# Patient Record
Sex: Female | Born: 1970
Health system: Southern US, Community
[De-identification: ages and names within clinical notes are randomized; demographics above are authoritative.]

## PROBLEM LIST (undated history)

## (undated) DIAGNOSIS — J449 Chronic obstructive pulmonary disease, unspecified: Secondary | ICD-10-CM

## (undated) DIAGNOSIS — I10 Essential (primary) hypertension: Secondary | ICD-10-CM

## (undated) DIAGNOSIS — G709 Myoneural disorder, unspecified: Secondary | ICD-10-CM

## (undated) DIAGNOSIS — F419 Anxiety disorder, unspecified: Secondary | ICD-10-CM

## (undated) DIAGNOSIS — F329 Major depressive disorder, single episode, unspecified: Secondary | ICD-10-CM

## (undated) DIAGNOSIS — F32A Depression, unspecified: Secondary | ICD-10-CM

## (undated) DIAGNOSIS — T7840XA Allergy, unspecified, initial encounter: Secondary | ICD-10-CM

## (undated) DIAGNOSIS — J45909 Unspecified asthma, uncomplicated: Secondary | ICD-10-CM

## (undated) DIAGNOSIS — J439 Emphysema, unspecified: Secondary | ICD-10-CM

## (undated) HISTORY — DX: Chronic obstructive pulmonary disease, unspecified: J44.9

## (undated) HISTORY — DX: Major depressive disorder, single episode, unspecified: F32.9

## (undated) HISTORY — DX: Anxiety disorder, unspecified: F41.9

## (undated) HISTORY — DX: Allergy, unspecified, initial encounter: T78.40XA

## (undated) HISTORY — PX: OTHER SURGICAL HISTORY: SHX169

## (undated) HISTORY — DX: Myoneural disorder, unspecified: G70.9

## (undated) HISTORY — DX: Depression, unspecified: F32.A

## (undated) HISTORY — DX: Unspecified asthma, uncomplicated: J45.909

## (undated) HISTORY — DX: Emphysema, unspecified: J43.9

## (undated) HISTORY — DX: Essential (primary) hypertension: I10

---

## 2013-02-11 HISTORY — PX: OTHER SURGICAL HISTORY: SHX169

## 2015-02-12 HISTORY — PX: HERNIA REPAIR: SHX51

## 2015-03-07 DIAGNOSIS — G8928 Other chronic postprocedural pain: Secondary | ICD-10-CM | POA: Diagnosis not present

## 2015-03-07 DIAGNOSIS — Z981 Arthrodesis status: Secondary | ICD-10-CM | POA: Diagnosis not present

## 2015-03-07 DIAGNOSIS — M62838 Other muscle spasm: Secondary | ICD-10-CM | POA: Diagnosis not present

## 2015-03-07 DIAGNOSIS — M4727 Other spondylosis with radiculopathy, lumbosacral region: Secondary | ICD-10-CM | POA: Diagnosis not present

## 2015-03-07 DIAGNOSIS — M5417 Radiculopathy, lumbosacral region: Secondary | ICD-10-CM | POA: Diagnosis not present

## 2015-03-07 DIAGNOSIS — G894 Chronic pain syndrome: Secondary | ICD-10-CM | POA: Diagnosis not present

## 2015-06-15 DIAGNOSIS — Z79891 Long term (current) use of opiate analgesic: Secondary | ICD-10-CM | POA: Diagnosis not present

## 2015-06-15 DIAGNOSIS — Z981 Arthrodesis status: Secondary | ICD-10-CM | POA: Diagnosis not present

## 2015-06-15 DIAGNOSIS — M4727 Other spondylosis with radiculopathy, lumbosacral region: Secondary | ICD-10-CM | POA: Diagnosis not present

## 2015-06-15 DIAGNOSIS — M62838 Other muscle spasm: Secondary | ICD-10-CM | POA: Diagnosis not present

## 2015-06-15 DIAGNOSIS — G894 Chronic pain syndrome: Secondary | ICD-10-CM | POA: Diagnosis not present

## 2015-09-13 DIAGNOSIS — Z79899 Other long term (current) drug therapy: Secondary | ICD-10-CM | POA: Diagnosis not present

## 2015-09-13 DIAGNOSIS — G894 Chronic pain syndrome: Secondary | ICD-10-CM | POA: Diagnosis not present

## 2015-09-13 DIAGNOSIS — M62838 Other muscle spasm: Secondary | ICD-10-CM | POA: Diagnosis not present

## 2015-09-13 DIAGNOSIS — M4727 Other spondylosis with radiculopathy, lumbosacral region: Secondary | ICD-10-CM | POA: Diagnosis not present

## 2015-09-13 DIAGNOSIS — Z981 Arthrodesis status: Secondary | ICD-10-CM | POA: Diagnosis not present

## 2015-10-11 DIAGNOSIS — D1721 Benign lipomatous neoplasm of skin and subcutaneous tissue of right arm: Secondary | ICD-10-CM | POA: Diagnosis not present

## 2015-10-19 DIAGNOSIS — L723 Sebaceous cyst: Secondary | ICD-10-CM | POA: Diagnosis not present

## 2015-10-19 DIAGNOSIS — D170 Benign lipomatous neoplasm of skin and subcutaneous tissue of head, face and neck: Secondary | ICD-10-CM | POA: Diagnosis not present

## 2015-10-19 DIAGNOSIS — F17200 Nicotine dependence, unspecified, uncomplicated: Secondary | ICD-10-CM | POA: Diagnosis not present

## 2015-10-19 DIAGNOSIS — J449 Chronic obstructive pulmonary disease, unspecified: Secondary | ICD-10-CM | POA: Diagnosis not present

## 2015-10-19 DIAGNOSIS — I10 Essential (primary) hypertension: Secondary | ICD-10-CM | POA: Diagnosis not present

## 2015-10-19 DIAGNOSIS — L720 Epidermal cyst: Secondary | ICD-10-CM | POA: Diagnosis not present

## 2015-11-03 DIAGNOSIS — L723 Sebaceous cyst: Secondary | ICD-10-CM | POA: Diagnosis not present

## 2015-11-10 DIAGNOSIS — L723 Sebaceous cyst: Secondary | ICD-10-CM | POA: Diagnosis not present

## 2015-11-24 DIAGNOSIS — L723 Sebaceous cyst: Secondary | ICD-10-CM | POA: Diagnosis not present

## 2015-11-27 ENCOUNTER — Ambulatory Visit (INDEPENDENT_AMBULATORY_CARE_PROVIDER_SITE_OTHER): Payer: Medicare Other

## 2015-11-27 ENCOUNTER — Ambulatory Visit (INDEPENDENT_AMBULATORY_CARE_PROVIDER_SITE_OTHER): Payer: Medicare Other | Admitting: Family Medicine

## 2015-11-27 VITALS — BP 142/88 | HR 88 | Temp 98.4°F | Resp 18 | Ht 67.75 in | Wt 140.2 lb

## 2015-11-27 DIAGNOSIS — J209 Acute bronchitis, unspecified: Secondary | ICD-10-CM

## 2015-11-27 DIAGNOSIS — J441 Chronic obstructive pulmonary disease with (acute) exacerbation: Secondary | ICD-10-CM | POA: Diagnosis not present

## 2015-11-27 DIAGNOSIS — I10 Essential (primary) hypertension: Secondary | ICD-10-CM | POA: Diagnosis not present

## 2015-11-27 DIAGNOSIS — R079 Chest pain, unspecified: Secondary | ICD-10-CM | POA: Diagnosis not present

## 2015-11-27 MED ORDER — BENZONATATE 100 MG PO CAPS: 100.0000 mg | ORAL_CAPSULE | Freq: Three times a day (TID) | ORAL | 0 refills | Status: DC | PRN

## 2015-11-27 MED ORDER — ALBUTEROL SULFATE HFA 108 (90 BASE) MCG/ACT IN AERS: 2.0000 | INHALATION_SPRAY | RESPIRATORY_TRACT | 1 refills | Status: DC | PRN

## 2015-11-27 MED ORDER — AMOXICILLIN-POT CLAVULANATE 875-125 MG PO TABS: 1.0000 | ORAL_TABLET | Freq: Two times a day (BID) | ORAL | 0 refills | Status: DC

## 2015-11-27 MED ORDER — BECLOMETHASONE DIPROPIONATE 80 MCG/ACT IN AERS: 1.0000 | INHALATION_SPRAY | Freq: Two times a day (BID) | RESPIRATORY_TRACT | 1 refills | Status: DC

## 2015-11-27 MED ORDER — IPRATROPIUM BROMIDE 0.02 % IN SOLN
0.5000 mg | Freq: Once | RESPIRATORY_TRACT | Status: AC
  Administered 2015-11-27: 0.5 mg via RESPIRATORY_TRACT

## 2015-11-27 MED ORDER — LISINOPRIL 10 MG PO TABS: 10.0000 mg | ORAL_TABLET | Freq: Every day | ORAL | 0 refills | Status: DC

## 2015-11-27 MED ORDER — ALBUTEROL SULFATE (2.5 MG/3ML) 0.083% IN NEBU
2.5000 mg | INHALATION_SOLUTION | Freq: Once | RESPIRATORY_TRACT | Status: AC
  Administered 2015-11-27: 2.5 mg via RESPIRATORY_TRACT

## 2015-11-27 MED ORDER — METOPROLOL TARTRATE 50 MG PO TABS: 50.0000 mg | ORAL_TABLET | Freq: Two times a day (BID) | ORAL | 0 refills | Status: DC

## 2015-11-27 MED ORDER — TIOTROPIUM BROMIDE MONOHYDRATE 18 MCG IN CAPS: 18.0000 ug | ORAL_CAPSULE | Freq: Every day | RESPIRATORY_TRACT | 2 refills | Status: DC

## 2015-11-27 NOTE — Progress Notes (Signed)
Patient ID: Rita Hunter, female    DOB: [birth year], 45 y.o.   MRN: [id number]  PCP: No PCP Per Patient  Chief Complaint  Patient presents with  . COPD    Subjective:   HPI 45 year old female, presents for evaluation of COPD exacerbation. She recently located to [facility] from [place] and has not seen a healthcare provider in months. She has gone at least one month without her chronic medications and presents today with a 4 days complain of cough, headache, chest tightness, moderate colored sputum production. Subjective fever,She has taken Nyquil for cough with minimal relief of symptoms. She also requests refills of her blood pressure medications lisinopril and metoprolol. She reports her blood pressure has been elevated over the last month due to not having any medication. Denies shortness of breath, chest tightness, no chest pain, or headaches. She requests a referral to pulmonology as she was followed by this specialty in the past for management of COPD.  Social History   Social History  . Marital status: Single    Spouse name: N/A  . Number of children: N/A  . Years of education: N/A   Occupational History  . Not on file.   Social History Main Topics  . Smoking status: Current Some Day Smoker  . Smokeless tobacco: Never Used  . Alcohol use No  . Drug use: No  . Sexual activity: Not on file   Other Topics Concern  . Not on file   Social History Narrative  . No narrative on file   Family History  Problem Relation Age of Onset  . Diabetes Mother   . Heart disease Mother   . Mental illness Mother   . Hyperlipidemia Father   . Stroke Father   . Diabetes Sister   . Heart disease Sister   . Hyperlipidemia Sister   . Stroke Sister      Review of Systems See HPI There are no active problems to display for this patient.    Prior to Admission medications   Medication Sig Start Date End Date Taking? Authorizing Provider  beclomethasone (QVAR) 80 MCG/ACT inhaler  Inhale 1 puff into the lungs 2 (two) times daily.   Yes Historical Provider, MD  butalbital-acetaminophen-caffeine (FIORICET WITH CODEINE) 50-325-40-30 MG capsule Take 1 capsule by mouth as needed for headache.   Yes Historical Provider, MD  diazepam (VALIUM) 5 MG tablet Take 5 mg by mouth every 6 (six) hours as needed for anxiety.   Yes Historical Provider, MD  fluticasone-salmeterol (ADVAIR HFA) 115-21 MCG/ACT inhaler Inhale 2 puffs into the lungs 2 (two) times daily.   Yes Historical Provider, MD  gabapentin (NEURONTIN) 100 MG capsule Take 100 mg by mouth 3 (three) times daily.   Yes Historical Provider, MD  HYDROcodone-acetaminophen (NORCO) 10-325 MG tablet Take 1 tablet by mouth every 6 (six) hours as needed.   Yes Historical Provider, MD  HYDROcodone-acetaminophen (NORCO/VICODIN) 5-325 MG tablet Take 1 tablet by mouth every 6 (six) hours as needed for moderate pain.   Yes Historical Provider, MD  ketorolac (TORADOL) 10 MG tablet Take 10 mg by mouth every 6 (six) hours as needed.   Yes Historical Provider, MD  lisinopril (PRINIVIL,ZESTRIL) 10 MG tablet Take 10 mg by mouth daily.   Yes Historical Provider, MD  metoprolol (LOPRESSOR) 50 MG tablet Take 50 mg by mouth 2 (two) times daily.   Yes Historical Provider, MD  mirtazapine (REMERON) 30 MG tablet Take 30 mg by mouth at bedtime.  Yes Historical Provider, MD  naloxone HCl (NARCAN) 4 MG/0.1ML LIQD Place into the nose.   Yes Historical Provider, MD  norgestimate-ethinyl estradiol (ORTHO-CYCLEN,SPRINTEC,PREVIFEM) 0.25-35 MG-MCG tablet Take 1 tablet by mouth daily.   Yes Historical Provider, MD  QUEtiapine (SEROQUEL) 200 MG tablet Take 200 mg by mouth at bedtime.   Yes Historical Provider, MD  sertraline (ZOLOFT) 100 MG tablet Take 100 mg by mouth daily.   Yes Historical Provider, MD  sulfamethoxazole-trimethoprim (BACTRIM DS,SEPTRA DS) 800-160 MG tablet Take 1 tablet by mouth 2 (two) times daily.   Yes Historical Provider, MD  SUMAtriptan  Succinate (SUMAVEL DOSEPRO [place]) Inject into the skin.   Yes Historical Provider, MD  tiotropium (SPIRIVA) 18 MCG inhalation capsule Place 18 mcg into inhaler and inhale daily.   Yes Historical Provider, MD  tiZANidine (ZANAFLEX) 4 MG tablet Take 4 mg by mouth every 6 (six) hours as needed for muscle spasms.   Yes Historical Provider, MD  topiramate (TOPAMAX) 25 MG tablet Take 25 mg by mouth 2 (two) times daily.   Yes Historical Provider, MD     Allergies  Allergen Reactions  . Mobic [Meloxicam] Swelling    Swelling of tongue   . Ultram [Tramadol Hcl] Swelling    Swelling of tongue    Objective:  Physical Exam  Constitutional: She is oriented to person, place, and time. She appears well-developed and well-nourished.  HENT:  Head: Normocephalic and atraumatic.  Right Ear: External ear normal.  Left Ear: External ear normal.  Mouth/Throat: Oropharynx is clear and moist.  Eyes: Conjunctivae and EOM are normal. Pupils are equal, round, and reactive to light.  Neck: Normal range of motion. Neck supple.  Cardiovascular: Normal rate, regular rhythm, normal heart sounds and intact distal pulses.   Pulmonary/Chest: She has no wheezes. She exhibits no tenderness.  Bilateral posterior lung sounds diminished. Notable diminished air movement upper bronchial region of anterior chest wall. Hacking wet non-productive type of cough  Musculoskeletal: Normal range of motion.  Lymphadenopathy:    She has cervical adenopathy.  Neurological: She is alert and oriented to person, place, and time. She has normal reflexes.  Skin: Skin is warm and dry.  Psychiatric: She has a normal mood and affect. Her behavior is normal. Thought content normal.    Vitals:   11/27/15 1132  BP: (!) 142/88  Pulse: 88  Resp: 18  Temp: 98.4 F (36.9 C)   Assessment & Plan:  1. Acute bronchitis, unspecified organism - DG Chest 2 View Plan: -Amoxicillin-clavulanate (Augmentin) 875-125 mg tablet 2 times,  daily -Benzonatate (Tessalon) 100 mg, take 100-200 mg up to 3 times daily as needed for cough.  2. COPD exacerbation ([place]) Plan: -Chest x-ray ruled out pneumonia, right middle lobe calcified granuloma noted-referring to pulmonology for further evaluation.  - ipratropium (ATROVENT) nebulizer solution 0.5 mg; Take 2.5 mLs (0.5 mg total) by nebulization once. - albuterol (PROVENTIL) (2.5 MG/3ML) 0.083% nebulizer solution 2.5 mg; Take 3 mLs (2.5 mg total) by nebulization once.  -Albuterol (Proventil HFA: Ventolin HFA) 108 (90 Base) MCG/ACT inhaler 2 puffs every 4 hours as needed for shortness or breath or wheezing  -Beclomethasone (Qvar) 80 Mcg/ACT inhaler 1 puff, 2 times daily   - Ambulatory referral to Pulmonology  -Smoking Cessation encouraged   3. Essential hypertension, uncontrolled. Patient reports being out of medications for over 1 month.   Plan: Resume Lisinopril 10 mg daily and Metroprolol 50 mg, 2 times daily.    Advised to schedule a visit to establish  care. I am unable to refill chronic medications until care is established and each of your chronic medical condition have been evaluated.  Follow-up as needed.  Carroll Sage. Kenton Kingfisher, MSN, FNP-C [facility]

## 2015-11-27 NOTE — Patient Instructions (Addendum)
Acute bronchitis Start Augmentin 1 tablet twice daily for treatment of acute bronchitis.  Take benzonatate 100-200 mg up to three times daily as needed.   COPD Start Albuterol 2 puffs every 4-6 hours as needed for wheezing and or shortness of breath.  Resume Spiriva 18 mcg daily inhalations.  Resume Qvar 1 puff, 2 times daily  Referral submitted to pulmonology for evaluation and treatment of COPD   Hypertension Resume lisinopril 10 mg daily  Resume metoprolol 50 mg twice daily   IF you received an x-ray today, you will receive an invoice from [facility]. Please contact [facility] at [phone] with questions or concerns regarding your invoice.   IF you received labwork today, you will receive an invoice from [employer]. Please contact [facility] at [phone] with questions or concerns regarding your invoice.   Our billing staff will not be able to assist you with questions regarding bills from these companies.  You will be contacted with the lab results as soon as they are available. The fastest way to get your results is to activate your My Chart account. Instructions are located on the last page of this paperwork. If you have not heard from [place] regarding the results in 2 weeks, please contact this office.

## 2015-12-18 DIAGNOSIS — G894 Chronic pain syndrome: Secondary | ICD-10-CM | POA: Diagnosis not present

## 2015-12-18 DIAGNOSIS — M62838 Other muscle spasm: Secondary | ICD-10-CM | POA: Diagnosis not present

## 2015-12-18 DIAGNOSIS — M4727 Other spondylosis with radiculopathy, lumbosacral region: Secondary | ICD-10-CM | POA: Diagnosis not present

## 2015-12-18 DIAGNOSIS — Z981 Arthrodesis status: Secondary | ICD-10-CM | POA: Diagnosis not present

## 2015-12-20 ENCOUNTER — Ambulatory Visit (INDEPENDENT_AMBULATORY_CARE_PROVIDER_SITE_OTHER): Payer: Medicare Other | Admitting: Family Medicine

## 2015-12-20 VITALS — BP 160/90 | HR 90 | Temp 98.6°F | Resp 17 | Ht 67.75 in | Wt 143.0 lb

## 2015-12-20 DIAGNOSIS — G43809 Other migraine, not intractable, without status migrainosus: Secondary | ICD-10-CM | POA: Diagnosis not present

## 2015-12-20 DIAGNOSIS — I10 Essential (primary) hypertension: Secondary | ICD-10-CM

## 2015-12-20 DIAGNOSIS — N83202 Unspecified ovarian cyst, left side: Secondary | ICD-10-CM | POA: Diagnosis not present

## 2015-12-20 DIAGNOSIS — F331 Major depressive disorder, recurrent, moderate: Secondary | ICD-10-CM | POA: Diagnosis not present

## 2015-12-20 DIAGNOSIS — N83201 Unspecified ovarian cyst, right side: Secondary | ICD-10-CM | POA: Diagnosis not present

## 2015-12-20 DIAGNOSIS — F39 Unspecified mood [affective] disorder: Secondary | ICD-10-CM | POA: Diagnosis not present

## 2015-12-20 DIAGNOSIS — G894 Chronic pain syndrome: Secondary | ICD-10-CM | POA: Diagnosis not present

## 2015-12-20 LAB — CBC WITH DIFFERENTIAL/PLATELET
BASOS ABS: 0 {cells}/uL (ref 0–200)
Basophils Relative: 0 %
EOS PCT: 2 %
Eosinophils Absolute: 198 cells/uL (ref 15–500)
HEMATOCRIT: 41.9 % (ref 35.0–45.0)
HEMOGLOBIN: 13.9 g/dL (ref 11.7–15.5)
LYMPHS PCT: 28 %
Lymphs Abs: 2772 cells/uL (ref 850–3900)
MCH: 31.5 pg (ref 27.0–33.0)
MCHC: 33.2 g/dL (ref 32.0–36.0)
MCV: 95 fL (ref 80.0–100.0)
MPV: 10.4 fL (ref 7.5–12.5)
Monocytes Absolute: 891 cells/uL (ref 200–950)
Monocytes Relative: 9 %
NEUTROS PCT: 61 %
Neutro Abs: 6039 cells/uL (ref 1500–7800)
Platelets: 361 10*3/uL (ref 140–400)
RBC: 4.41 MIL/uL (ref 3.80–5.10)
RDW: 12.8 % (ref 11.0–15.0)
WBC: 9.9 10*3/uL (ref 3.8–10.8)

## 2015-12-20 LAB — COMPLETE METABOLIC PANEL WITH GFR
ALBUMIN: 4.3 g/dL (ref 3.6–5.1)
ALK PHOS: 48 U/L (ref 33–115)
ALT: 12 U/L (ref 6–29)
AST: 18 U/L (ref 10–35)
BUN: 11 mg/dL (ref 7–25)
CALCIUM: 9.5 mg/dL (ref 8.6–10.2)
CHLORIDE: 103 mmol/L (ref 98–110)
CO2: 24 mmol/L (ref 20–31)
Creat: 0.7 mg/dL (ref 0.50–1.10)
GFR, Est African American: >89 mL/min (ref >=60)
Glucose, Bld: 102 mg/dL — ABNORMAL HIGH (ref 65–99)
POTASSIUM: 4.3 mmol/L (ref 3.5–5.3)
SODIUM: 138 mmol/L (ref 135–146)
Total Bilirubin: 0.3 mg/dL (ref 0.2–1.2)
Total Protein: 6.9 g/dL (ref 6.1–8.1)

## 2015-12-20 LAB — POC MICROSCOPIC URINALYSIS (UMFC): Mucus: ABSENT

## 2015-12-20 LAB — POCT URINALYSIS DIP (MANUAL ENTRY)
Bilirubin, UA: NEGATIVE
Glucose, UA: NEGATIVE
Ketones, POC UA: NEGATIVE
LEUKOCYTES UA: NEGATIVE
Nitrite, UA: NEGATIVE
PROTEIN UA: NEGATIVE
Spec Grav, UA: <=1.005
UROBILINOGEN UA: 0.2
pH, UA: 5.5

## 2015-12-20 LAB — LIPID PANEL
CHOL/HDL RATIO: 1.9 ratio (ref <5.0)
CHOLESTEROL: 158 mg/dL (ref <200)
HDL: 85 mg/dL (ref >50)
LDL Cholesterol: 51 mg/dL
TRIGLYCERIDES: 112 mg/dL (ref <150)
VLDL: 22 mg/dL (ref <30)

## 2015-12-20 LAB — TSH: TSH: 1.22 m[IU]/L

## 2015-12-20 MED ORDER — HYDROCODONE-ACETAMINOPHEN 7.5-325 MG PO TABS: 1.0000 | ORAL_TABLET | Freq: Four times a day (QID) | ORAL | 0 refills | Status: DC | PRN

## 2015-12-20 MED ORDER — TIZANIDINE HCL 4 MG PO TABS: 4.0000 mg | ORAL_TABLET | Freq: Four times a day (QID) | ORAL | 2 refills | Status: DC | PRN

## 2015-12-20 MED ORDER — GABAPENTIN 300 MG PO CAPS: 600.0000 mg | ORAL_CAPSULE | Freq: Three times a day (TID) | ORAL | 1 refills | Status: DC

## 2015-12-20 MED ORDER — HYDROCODONE-ACETAMINOPHEN 7.5-325 MG PO TABS
1.0000 | ORAL_TABLET | Freq: Four times a day (QID) | ORAL | 0 refills | Status: DC | PRN
Start: 2015-12-20 — End: 2015-12-20

## 2015-12-20 MED ORDER — FLUTICASONE-SALMETEROL 115-21 MCG/ACT IN AERO: 2.0000 | INHALATION_SPRAY | Freq: Two times a day (BID) | RESPIRATORY_TRACT | 11 refills | Status: DC

## 2015-12-20 MED ORDER — SERTRALINE HCL 100 MG PO TABS: 100.0000 mg | ORAL_TABLET | Freq: Every day | ORAL | 3 refills | Status: DC

## 2015-12-20 MED ORDER — TIOTROPIUM BROMIDE MONOHYDRATE 18 MCG IN CAPS: 18.0000 ug | ORAL_CAPSULE | Freq: Every day | RESPIRATORY_TRACT | 6 refills | Status: DC

## 2015-12-20 MED ORDER — LISINOPRIL 10 MG PO TABS: 10.0000 mg | ORAL_TABLET | Freq: Every day | ORAL | 3 refills | Status: DC

## 2015-12-20 MED ORDER — METOPROLOL TARTRATE 50 MG PO TABS: 50.0000 mg | ORAL_TABLET | Freq: Two times a day (BID) | ORAL | 3 refills | Status: DC

## 2015-12-20 MED ORDER — TOPIRAMATE 25 MG PO TABS: 25.0000 mg | ORAL_TABLET | Freq: Two times a day (BID) | ORAL | 3 refills | Status: DC

## 2015-12-20 MED ORDER — QUETIAPINE FUMARATE 200 MG PO TABS: 200.0000 mg | ORAL_TABLET | Freq: Every day | ORAL | 3 refills | Status: DC

## 2015-12-20 NOTE — Patient Instructions (Addendum)
See the attached for list of pain management clinics.  I am placing a referral for you to see an OB/GYN.  All medications have been refilled.  Continue to monitor blood pressure medications.  IF you received an x-ray today, you will receive an invoice from [facility]. Please contact [facility] at [phone] with questions or concerns regarding your invoice.   IF you received labwork today, you will receive an invoice from [employer]. Please contact [facility] at [phone] with questions or concerns regarding your invoice.   Our billing staff will not be able to assist you with questions regarding bills from these companies.  You will be contacted with the lab results as soon as they are available. The fastest way to get your results is to activate your My Chart account. Instructions are located on the last page of this paperwork. If you have not heard from [place] regarding the results in 2 weeks, please contact this office.     Hypertension Hypertension, commonly called high blood pressure, is when the force of blood pumping through your arteries is too strong. Your arteries are the blood vessels that carry blood from your heart throughout your body. A blood pressure reading consists of a higher number over a lower number, such as 110/72. The higher number (systolic) is the pressure inside your arteries when your heart pumps. The lower number (diastolic) is the pressure inside your arteries when your heart relaxes. Ideally you want your blood pressure below 120/80. Hypertension forces your heart to work harder to pump blood. Your arteries may become narrow or stiff. Having untreated or uncontrolled hypertension can cause heart attack, stroke, kidney disease, and other problems. RISK FACTORS Some risk factors for high blood pressure are controllable. Others are not.  Risk factors you cannot control include:   Race. You may be at higher risk if you  are African American.  Age. Risk increases with age.  Gender. Men are at higher risk than women before age 76 years. After age 9, women are at higher risk than men. Risk factors you can control include:  Not getting enough exercise or physical activity.  Being overweight.  Getting too much fat, sugar, calories, or salt in your diet.  Drinking too much alcohol. SIGNS AND SYMPTOMS Hypertension does not usually cause signs or symptoms. Extremely high blood pressure (hypertensive crisis) may cause headache, anxiety, shortness of breath, and nosebleed. DIAGNOSIS To check if you have hypertension, your health care provider will measure your blood pressure while you are seated, with your arm held at the level of your heart. It should be measured at least twice using the same arm. Certain conditions can cause a difference in blood pressure between your right and left arms. A blood pressure reading that is higher than normal on one occasion does not mean that you need treatment. If it is not clear whether you have high blood pressure, you may be asked to return on a different day to have your blood pressure checked again. Or, you may be asked to monitor your blood pressure at home for 1 or more weeks. TREATMENT Treating high blood pressure includes making lifestyle changes and possibly taking medicine. Living a healthy lifestyle can help lower high blood pressure. You may need to change some of your habits. Lifestyle changes may include:  Following the DASH diet. This diet is high in fruits, vegetables, and whole grains. It is low in salt, red meat, and added sugars.  Keep your sodium intake  below 2,300 mg per day.  Getting at least 30-45 minutes of aerobic exercise at least 4 times per week.  Losing weight if necessary.  Not smoking.  Limiting alcoholic beverages.  Learning ways to reduce stress. Your health care provider may prescribe medicine if lifestyle changes are not enough to get  your blood pressure under control, and if one of the following is true:  You are 34-45 years of age and your systolic blood pressure is above 140.  You are 82 years of age or older, and your systolic blood pressure is above 150.  Your diastolic blood pressure is above 90.  You have diabetes, and your systolic blood pressure is over [id number] or your diastolic blood pressure is over 90.  You have kidney disease and your blood pressure is above 140/90.  You have heart disease and your blood pressure is above 140/90. Your personal target blood pressure may vary depending on your medical conditions, your age, and other factors. HOME CARE INSTRUCTIONS  Have your blood pressure rechecked as directed by your health care provider.   Take medicines only as directed by your health care provider. Follow the directions carefully. Blood pressure medicines must be taken as prescribed. The medicine does not work as well when you skip doses. Skipping doses also puts you at risk for problems.  Do not smoke.   Monitor your blood pressure at home as directed by your health care provider. SEEK MEDICAL CARE IF:   You think you are having a reaction to medicines taken.  You have recurrent headaches or feel dizzy.  You have swelling in your ankles.  You have trouble with your vision. SEEK IMMEDIATE MEDICAL CARE IF:  You develop a severe headache or confusion.  You have unusual weakness, numbness, or feel faint.  You have severe chest or abdominal pain.  You vomit repeatedly.  You have trouble breathing. MAKE SURE YOU:   Understand these instructions.  Will watch your condition.  Will get help right away if you are not doing well or get worse.   This information is not intended to replace advice given to you by your health care provider. Make sure you discuss any questions you have with your health care provider.   Document Released: 01/28/2005 Document Revised: 06/14/2014 Document  Reviewed: 11/20/2012 Elsevier Interactive Patient Education [employer].

## 2015-12-20 NOTE — Progress Notes (Signed)
Patient ID: Rita Hunter, female    DOB: [birth year], 45 y.o.   MRN: [id number]  PCP: No PCP Per Patient  Chief Complaint  Patient presents with  . Depression    Per screening  . Establish Care    Review meds    Subjective:   HPI 45 year old female presents to establish care. She recently relocated from [place] to [place] to live with her sister. She was seen originally at [facility] 11/27/2015. Reports chronic problems include migraine headaches, depression with paranoia, hypertension, chronic pain, and COPD.   Migraine Headaches  She take Topamax 25 mg twice daily for headache prevention. Reports an occasional headache monthly. Reports visual disturbances when headaches occur. She has never been evaluated by a headache specialist before. Overall reports good headache control.  Depression She reports chronic major depression for several years. Denies any current suicidal thoughts. She has been taking Zoloft for over 3 years. Reports also reports bouts with significant paranoia and was placed on Seroquel to control those thoughts and to promote rest.  Hypertension Reports a history of elevated blood pressure. She doesn't routinely check her blood pressure and reports no regular routine exercise. Denies headaches, chest pain, and shortness of breath. She takes Metoprolol 50 mg twice daily and lisinopril 10 mg once daily.  Chronic Pain Reports her primary care provided treated her for chronic low back and foot nerve pain. She takes Hydrocodone-acetaminophen 7.5-325 mg  and Gabapentin 600 mg 3 times daily. Denies any prior evaluation by pain management.   Family history Heart disease- Sister 58 living with CVA and has CAD with stents placed at 68 years old. Dad died of CVA and Diabetes. Mother died of massive Heart attack and Diabetes.  Review of Systems See HPI  There are no active problems to display for this patient.    Prior to Admission medications     Medication Sig Start Date End Date Taking? Authorizing Provider  albuterol (PROVENTIL HFA;VENTOLIN HFA) 108 (90 Base) MCG/ACT inhaler Inhale 2 puffs into the lungs every 4 (four) hours as needed for wheezing or shortness of breath (cough, shortness of breath or wheezing.). 11/27/15  Yes Sedalia Muta, FNP  amoxicillin-clavulanate (AUGMENTIN) 875-125 MG tablet Take 1 tablet by mouth 2 (two) times daily. 11/27/15  Yes Sedalia Muta, FNP  beclomethasone (QVAR) 80 MCG/ACT inhaler Inhale 1 puff into the lungs 2 (two) times daily. 11/27/15  Yes Sedalia Muta, FNP  benzonatate (TESSALON) 100 MG capsule Take 1-2 capsules (100-200 mg total) by mouth 3 (three) times daily as needed for cough. 11/27/15  Yes Sedalia Muta, FNP  butalbital-acetaminophen-caffeine (FIORICET WITH CODEINE) [phone] MG capsule Take 1 capsule by mouth as needed for headache.   Yes Historical Provider, MD  diazepam (VALIUM) 5 MG tablet Take 5 mg by mouth every 6 (six) hours as needed for anxiety.   Yes Historical Provider, MD  fluticasone-salmeterol (ADVAIR HFA) 115-21 MCG/ACT inhaler Inhale 2 puffs into the lungs 2 (two) times daily.   Yes Historical Provider, MD  gabapentin (NEURONTIN) 100 MG capsule Take 100 mg by mouth 3 (three) times daily.   Yes Historical Provider, MD  HYDROcodone-acetaminophen (NORCO) 10-325 MG tablet Take 1 tablet by mouth every 6 (six) hours as needed.   Yes Historical Provider, MD  lisinopril (PRINIVIL,ZESTRIL) 10 MG tablet Take 1 tablet (10 mg total) by mouth daily. 11/27/15  Yes Sedalia Muta, FNP  metoprolol (LOPRESSOR) 50 MG tablet Take 1 tablet (50 mg total) by  mouth 2 (two) times daily. 11/27/15  Yes Sedalia Muta, FNP  mirtazapine (REMERON) 30 MG tablet Take 30 mg by mouth at bedtime.   Yes Historical Provider, MD  naloxone HCl (NARCAN) 4 MG/0.1ML LIQD Place into the nose.   Yes Historical Provider, MD  norgestimate-ethinyl  estradiol (ORTHO-CYCLEN,SPRINTEC,PREVIFEM) 0.25-35 MG-MCG tablet Take 1 tablet by mouth daily.   Yes Historical Provider, MD  QUEtiapine (SEROQUEL) 200 MG tablet Take 200 mg by mouth at bedtime.   Yes Historical Provider, MD  sertraline (ZOLOFT) 100 MG tablet Take 100 mg by mouth daily.   Yes Historical Provider, MD  sulfamethoxazole-trimethoprim (BACTRIM DS,SEPTRA DS) 800-160 MG tablet Take 1 tablet by mouth 2 (two) times daily.   Yes Historical Provider, MD  SUMAtriptan Succinate (SUMAVEL DOSEPRO [place]) Inject into the skin.   Yes Historical Provider, MD  tiotropium (SPIRIVA) 18 MCG inhalation capsule Place 1 capsule (18 mcg total) into inhaler and inhale daily. 11/27/15  Yes Sedalia Muta, FNP  tiZANidine (ZANAFLEX) 4 MG tablet Take 4 mg by mouth every 6 (six) hours as needed for muscle spasms.   Yes Historical Provider, MD  topiramate (TOPAMAX) 25 MG tablet Take 25 mg by mouth 2 (two) times daily.   Yes Historical Provider, MD     Allergies  Allergen Reactions  . Mobic [Meloxicam] Swelling    Swelling of tongue   . Ultram [Tramadol Hcl] Swelling    Swelling of tongue      Objective:  Physical Exam  Constitutional: She is oriented to person, place, and time. She appears well-developed and well-nourished.  HENT:  Head: Normocephalic and atraumatic.  Right Ear: External ear normal.  Left Ear: External ear normal.  Nose: Nose normal.  Mouth/Throat: Oropharynx is clear and moist.  Eyes: Conjunctivae are normal. Pupils are equal, round, and reactive to light.  Neck: Normal range of motion. Neck supple. No thyromegaly present.  Cardiovascular: Normal rate, regular rhythm, normal heart sounds and intact distal pulses.   Pulmonary/Chest: Effort normal and breath sounds normal.  Genitourinary:  Genitourinary Comments: Deferred until GYN visit.  Musculoskeletal: Normal range of motion.  Lymphadenopathy:    She has no cervical adenopathy.  Neurological: She is alert and  oriented to person, place, and time.  Skin: Skin is warm and dry.  Psychiatric: She has a normal mood and affect. Her behavior is normal. Judgment and thought content normal.   Vitals:   12/20/15 1332  BP: (!) 160/90  Pulse: 90  Resp: 17  Temp: 98.6 F (37 C)    Assessment & Plan:  1. Essential hypertension, unstable. - TSH - POCT Microscopic Urinalysis ([facility]) - POCT urinalysis dipstick - CBC with Differential/Platelet - COMPLETE METABOLIC PANEL WITH GFR - Lipid panel Plan: -Metoprolol 50 mg twice daily -Lisinopril 10 mg tablet once daily  2. Moderate episode of recurrent major depressive disorder (HCC) Plan: -Sertraline (Zoloft) 100 mg once daily  3. Mood disorder (HCC) Plan: Seroquel 200 mg, daily at bedtime  4. Chronic pain syndrome Plan: -Gabapentin 600 mg up to 3 times daily for foot nerve pain. -Hydrocodone-Acetaminophen 7.5 mg-325 mg every 6 hours as needed for pain. -Zanaflex 4 mg every 6 hours as needed for an acute headache.  5. Other migraine without status migrainosus, not intractable -Topamax 25 mg twice daily for headache prevention -Zanaflex 4 mg every 6 hours as needed for an acute headache.  6. Cysts of both ovaries,  - Ambulatory referral to Obstetrics / Gynecology  45 year old , female  presents to the clinic to establish care. I have seen her previously and treated for chronic bronchitis. She presents today for medication refills on her chronic medications. She has previously been prescribed high-risk medications such as Diazepam (Valium), Mirtazapine (Remeron), butalbital acetaminophen caffeine  (Foricet) and hydrocodone-acetaminophen.  I agreed only to refill hydrocodone-acetaminophen #20 at this time and referred patient to pain management for consideration of refilling other medication and or changing pain medicines in order for pain to be better managed.  Carroll Sage. Kenton Kingfisher, MSN, FNP-C [facility]

## 2015-12-21 ENCOUNTER — Institutional Professional Consult (permissible substitution): Payer: Self-pay | Admitting: Emergency Medicine

## 2015-12-21 ENCOUNTER — Encounter: Payer: Self-pay | Admitting: Family Medicine

## 2015-12-21 NOTE — Progress Notes (Signed)
December 21, 2015   [facility] [place] [id number]   Dear Ms. Chesney,  Below are the results from your recent visit were expected.  Resulted Orders  TSH  Result Value Ref Range   TSH 1.22 mIU/L     Comment:       Reference Range   > or = 20 Years  0.40-4.50   Pregnancy Range First trimester  0.26-2.66 Second trimester 0.55-2.73 Third trimester  0.43-2.91      Narrative   Performed at:  [address], [address]                [place], [place] [address]  CBC with Differential/Platelet  Result Value Ref Range   WBC 9.9 3.8 - 10.8 K/uL   RBC 4.41 3.80 - 5.10 MIL/uL   Hemoglobin 13.9 11.7 - 15.5 g/dL   HCT 41.9 35.0 - 45.0 %   MCV 95.0 80.0 - 100.0 fL   MCH 31.5 27.0 - 33.0 pg   MCHC 33.2 32.0 - 36.0 g/dL   RDW 12.8 11.0 - 15.0 %   Platelets 361 140 - 400 K/uL   MPV 10.4 7.5 - 12.5 fL   Neutro Abs 6,039 1,500 - 7,800 cells/uL   Lymphs Abs 2,772 850 - 3,900 cells/uL   Monocytes Absolute 891 200 - 950 cells/uL   Eosinophils Absolute 198 15 - 500 cells/uL   Basophils Absolute 0 0 - 200 cells/uL   Neutrophils Relative % 61 %   Lymphocytes Relative 28 %   Monocytes Relative 9 %   Eosinophils Relative 2 %   Basophils Relative 0 %   Smear Review Criteria for review not met    Narrative   Performed at:  [facility]                [address], [address]                [place], [place] [address]  COMPLETE METABOLIC PANEL WITH GFR  Result Value Ref Range   Sodium 138 135 - 146 mmol/L   Potassium 4.3 3.5 - 5.3 mmol/L   Chloride 103 98 - 110 mmol/L   CO2 24 20 - 31 mmol/L   Glucose, Bld 102 (H) 65 - 99 mg/dL   BUN 11 7 - 25 mg/dL   Creat 0.70 0.50 - 1.10 mg/dL   Total Bilirubin 0.3 0.2 - 1.2 mg/dL   Alkaline Phosphatase 48 33 - 115 U/L   AST 18 10 - 35 U/L   ALT 12 6 - 29 U/L   Total Protein 6.9 6.1 - 8.1 g/dL   Albumin 4.3 3.6 - 5.1 g/dL   Calcium 9.5 8.6 - 10.2 mg/dL   GFR, Est African American >89 >=60  mL/min   GFR, Est Non African American >89 >=60 mL/min   Narrative   Performed at:  [address], [address]                [place], [place] [address]  Lipid panel  Result Value Ref Range   Cholesterol 158 <200 mg/dL     Comment:     ** Please note change in reference range(s). **      Triglycerides 112 <150 mg/dL     Comment:     **  Please note change in reference range(s). **      HDL 85 >50 mg/dL     Comment:     ** Please note change in reference range(s). **      Total CHOL/HDL Ratio 1.9 <5.0 Ratio   VLDL 22 <30 mg/dL   LDL Cholesterol 51 mg/dL     Comment:     ** Please note change in reference range(s). **      Narrative   Performed at:  [address], [address]                [place], [place] [address]     If you have any questions or concerns, please don't hesitate to call.  Sincerely,   Carroll Sage. Kenton Kingfisher, MSN, FNP-C [facility]

## 2015-12-26 DIAGNOSIS — F331 Major depressive disorder, recurrent, moderate: Secondary | ICD-10-CM | POA: Insufficient documentation

## 2015-12-26 DIAGNOSIS — I10 Essential (primary) hypertension: Secondary | ICD-10-CM | POA: Insufficient documentation

## 2015-12-26 DIAGNOSIS — N83202 Unspecified ovarian cyst, left side: Secondary | ICD-10-CM

## 2015-12-26 DIAGNOSIS — G894 Chronic pain syndrome: Secondary | ICD-10-CM | POA: Insufficient documentation

## 2015-12-26 DIAGNOSIS — F39 Unspecified mood [affective] disorder: Secondary | ICD-10-CM | POA: Insufficient documentation

## 2015-12-26 DIAGNOSIS — Z79899 Other long term (current) drug therapy: Secondary | ICD-10-CM | POA: Insufficient documentation

## 2015-12-26 DIAGNOSIS — G43909 Migraine, unspecified, not intractable, without status migrainosus: Secondary | ICD-10-CM | POA: Insufficient documentation

## 2015-12-26 DIAGNOSIS — N83201 Unspecified ovarian cyst, right side: Secondary | ICD-10-CM | POA: Insufficient documentation

## 2015-12-29 ENCOUNTER — Ambulatory Visit (INDEPENDENT_AMBULATORY_CARE_PROVIDER_SITE_OTHER): Payer: Medicare Other | Admitting: Physician Assistant

## 2015-12-29 VITALS — BP 122/72 | HR 102 | Temp 98.2°F | Resp 17 | Ht 68.0 in | Wt 144.0 lb

## 2015-12-29 DIAGNOSIS — G8929 Other chronic pain: Secondary | ICD-10-CM | POA: Diagnosis not present

## 2015-12-29 DIAGNOSIS — F5101 Primary insomnia: Secondary | ICD-10-CM

## 2015-12-29 DIAGNOSIS — M545 Low back pain: Secondary | ICD-10-CM

## 2015-12-29 DIAGNOSIS — F17200 Nicotine dependence, unspecified, uncomplicated: Secondary | ICD-10-CM

## 2015-12-29 DIAGNOSIS — G2581 Restless legs syndrome: Secondary | ICD-10-CM | POA: Diagnosis not present

## 2015-12-29 MED ORDER — HYDROCODONE-ACETAMINOPHEN 10-325 MG PO TABS: 1.0000 | ORAL_TABLET | Freq: Three times a day (TID) | ORAL | 0 refills | Status: DC | PRN

## 2015-12-29 MED ORDER — HYDROCODONE-ACETAMINOPHEN 10-325 MG PO TABS: 1.0000 | ORAL_TABLET | Freq: Four times a day (QID) | ORAL | 0 refills | Status: DC | PRN

## 2015-12-29 MED ORDER — TRAZODONE HCL 50 MG PO TABS: 50.0000 mg | ORAL_TABLET | Freq: Every day | ORAL | 1 refills | Status: DC

## 2015-12-29 MED ORDER — ROPINIROLE HCL 1 MG PO TABS: 0.5000 mg | ORAL_TABLET | Freq: Every day | ORAL | 1 refills | Status: DC

## 2015-12-29 MED ORDER — VARENICLINE TARTRATE 1 MG PO TABS: 1.0000 mg | ORAL_TABLET | Freq: Two times a day (BID) | ORAL | 2 refills | Status: DC

## 2015-12-29 MED ORDER — VARENICLINE TARTRATE 0.5 MG X 11 & 1 MG X 42 PO MISC: ORAL | 0 refills | Status: DC

## 2015-12-29 NOTE — Progress Notes (Signed)
12/29/2015 12:54 PM   DOB: 06-25-1970 / MRN: [id number]  SUBJECTIVE:  Rita Hunter is a 45 y.o. female presenting to establish care. Her sister is a patient of mine.    She complains of poor sleep.  She takes seroquel for a history of paranoia and has been diagnosed with schizophrenia in the past by her previous psychiatrist.  Reports the seroquel helps her sleep but her legs twitch so badly that she wakes up.  Has tried Mirtazipine for sleep in the past and says the RLS was worse on this.  She is concerned about taking bellsomra as she read this is bad to take given her history of COPD.  She want's to try lunesta.    She has a history of chronic back pain and has previously had back surgery which does not help her symptoms.  The last surgery was roughly 6 years ago.  Her orthopedic doctor wants her to have another surgery.  She complains of chronic left leg numbness 2/2 and pain.  She takes gabapentin and does get some relief of radiculopathy with this.  She was taking Norco 10 mg every 4 hours and this was helping her with her back pain.  It is unclear why she is no longer seeing that provider.  She can to see NP Harris who fortunately reduced her dose to 7.5 every 6 hours. Patient states that she did not take this as prescribed and was taking more than what was prescribed.  Fortunaely she is not taking any gabaergics.  She would like to quit smoking.  Her sister is currently taking Chantix and she would like to try this as well.  She has tried patches, gum and these have never been helpful for her.      She is allergic to mobic [meloxicam] and ultram [tramadol hcl].   She  has a past medical history of Allergy; Anxiety; Asthma; COPD (chronic obstructive pulmonary disease) ([facility]); Depression; Emphysema of lung (Rita Hunter); and Neuromuscular disorder ([facility]ita Hunter).    She  reports that she has been smoking.  She has never used smokeless tobacco. She reports that she does not drink alcohol or use drugs. She   has no sexual activity history on file. The patient  has a past surgical history that includes Hernia repair.  Her family history includes Diabetes in her mother and sister; Heart disease in her mother and sister; Hyperlipidemia in her father and sister; Mental illness in her mother; Stroke in her father and sister.  Review of Systems  Constitutional: Negative for chills and fever.  Eyes: Negative for blurred vision.  Respiratory: Negative for cough and shortness of breath.   Cardiovascular: Negative for chest pain.  Gastrointestinal: Negative for abdominal pain and nausea.  Genitourinary: Negative for dysuria, frequency and urgency.  Musculoskeletal: Negative for myalgias.  Skin: Negative for rash.  Neurological: Negative for dizziness, tingling and headaches.  Psychiatric/Behavioral: Positive for depression. Negative for hallucinations, memory loss and substance abuse. The patient is nervous/anxious. The patient does not have insomnia.     The problem list and medications were reviewed and updated by myself where necessary and exist elsewhere in the encounter.   OBJECTIVE:  BP 122/72 (BP Location: Right Arm, Patient Position: Sitting, Cuff Size: Normal)   Pulse (!) 102   Temp 98.2 F (36.8 C) (Oral)   Resp 17   Ht 5\' 8"  (1.727 m)   Wt 144 lb (65.3 kg)   LMP 12/17/2015   SpO2 96%   BMI 21.90  kg/m   Physical Exam  Constitutional: She is oriented to person, place, and time. She appears well-nourished. No distress.  Eyes: EOM are normal. Pupils are equal, round, and reactive to light.  Cardiovascular: Normal rate and regular rhythm.   Pulmonary/Chest: Effort normal and breath sounds normal.  Abdominal: She exhibits no distension.  Neurological: She is alert and oriented to person, place, and time. No cranial nerve deficit. Gait normal.  Skin: Skin is warm and dry. She is not diaphoretic.  Psychiatric: She has a normal mood and affect.  Vitals reviewed.   No results found for  this or any previous visit (from the past 72 hour(s)).  No results found.  ASSESSMENT AND PLAN  Rita Hunter was seen today for establish care.  Diagnoses and all orders for this visit:  Chronic midline low back pain without sciatica: Fortunately her dose has recently been reduced. I am refilling and will carry her prescription until she get into to see Dr. Andree Elk in pain (her sister sees same.) Fortunately she is not taking any benzodiazepines and I will strive to help her with problem 2 without adding gabaergics.   -     Ambulatory referral to Pain Clinic -     HYDROcodone-acetaminophen (NORCO) 10-325 MG tablet; Take 1 tablet by mouth every 6 (six) hours as needed for severe pain.  Primary insomnia -     traZODone (DESYREL) 50 MG tablet; Take 1-2 tablets (50-100 mg total) by mouth at bedtime.  Smoking -     varenicline (CHANTIX STARTING MONTH PAK) 0.5 MG X 11 & 1 MG X 42 tablet; Take one 0.5 mg tablet by mouth once daily for 3 days, then increase to one 0.5 mg tablet twice daily for 4 days, then increase to one 1 mg tablet twice daily. -     varenicline (CHANTIX CONTINUING MONTH PAK) 1 MG tablet; Take 1 tablet (1 mg total) by mouth 2 (two) times daily.  RLS (restless legs syndrome) -     rOPINIRole (REQUIP) 1 MG tablet; Take 0.5-1 tablets (0.5-1 mg total) by mouth at bedtime.  Other orders -     Discontinue: HYDROcodone-acetaminophen (NORCO) 10-325 MG tablet; Take 1 tablet by mouth every 8 (eight) hours as needed. -     Discontinue: varenicline (CHANTIX STARTING MONTH PAK) 0.5 MG X 11 & 1 MG X 42 tablet; Take one 0.5 mg tablet by mouth once daily for 3 days, then increase to one 0.5 mg tablet twice daily for 4 days, then increase to one 1 mg tablet twice daily. -     Discontinue: varenicline (CHANTIX CONTINUING MONTH PAK) 1 MG tablet; Take 1 tablet (1 mg total) by mouth 2 (two) times daily.    The patient is advised to call or return to clinic if she does not see an improvement in symptoms,  or to seek the care of the closest emergency department if she worsens with the above plan.   [name], MHS, PA-C [facility] 12/29/2015 12:54 PM

## 2015-12-29 NOTE — Patient Instructions (Addendum)
I can carry your opioid for 1 month only while you are awaiting Dr. Andree Elk.    I am referring you to a psychiatrist as well and they will be able to help with depression/anxiety.      IF you received an x-ray today, you will receive an invoice from [facility]. Please contact [facility] at [phone] with questions or concerns regarding your invoice.   IF you received labwork today, you will receive an invoice from [employer]. Please contact [facility] at [phone] with questions or concerns regarding your invoice.   Our billing staff will not be able to assist you with questions regarding bills from these companies.  You will be contacted with the lab results as soon as they are available. The fastest way to get your results is to activate your My Chart account. Instructions are located on the last page of this paperwork. If you have not heard from [place] regarding the results in 2 weeks, please contact this office.

## 2016-01-02 ENCOUNTER — Telehealth: Payer: Self-pay

## 2016-01-02 NOTE — Telephone Encounter (Signed)
Pt is needing a refill on hydrocodone  Best number [phone]

## 2016-01-03 ENCOUNTER — Other Ambulatory Visit: Payer: Self-pay | Admitting: Physician Assistant

## 2016-01-03 DIAGNOSIS — M545 Low back pain, unspecified: Secondary | ICD-10-CM

## 2016-01-03 DIAGNOSIS — G8929 Other chronic pain: Secondary | ICD-10-CM

## 2016-01-03 MED ORDER — HYDROCODONE-ACETAMINOPHEN 10-325 MG PO TABS: 1.0000 | ORAL_TABLET | Freq: Four times a day (QID) | ORAL | 0 refills | Status: DC | PRN

## 2016-01-03 NOTE — Progress Notes (Signed)
Refilling as Norco as discussed in my last office visit.  Awaiting referral to Dr. Andree Elk in pain to go through. [name], MS, PA-C 1:06 PM, 01/03/2016

## 2016-01-03 NOTE — Telephone Encounter (Signed)
Rita Hunter is her PCP and just filled her hydrocodone on 12/29/15. I will not refill medication. Patient is suppose to be following up with Pain Management.

## 2016-01-03 NOTE — Telephone Encounter (Signed)
Attempted to contact pt, left VM for pt to call back. We refilled prescription at her last visit 11/17 (20 tablets) and instructed her to take 1 every 6 hours.  Routed to Lav[facility] Anchors, please advise.

## 2016-01-09 ENCOUNTER — Telehealth: Payer: Self-pay

## 2016-01-09 ENCOUNTER — Other Ambulatory Visit: Payer: Self-pay | Admitting: Physician Assistant

## 2016-01-09 DIAGNOSIS — G8929 Other chronic pain: Secondary | ICD-10-CM

## 2016-01-09 DIAGNOSIS — M5442 Lumbago with sciatica, left side: Principal | ICD-10-CM

## 2016-01-09 DIAGNOSIS — F39 Unspecified mood [affective] disorder: Secondary | ICD-10-CM

## 2016-01-09 NOTE — Telephone Encounter (Signed)
[facility] called. Dr Andree Elk is requesting an MRI of the lumbar spine to be ordered and for the pt to establish with a psychiatrist before he will schedule with her. Please advise.

## 2016-01-09 NOTE — Telephone Encounter (Signed)
Spoke with pt. Called back to give MRI date/time and left Vm. MRI scheduled for 12/4 at 5pm at [facility] (arrive 15 min early). Call [phone] to reschedule if needed.  Referral to psych sent to [facility], pt given contact number of [phone] if she would like to contact them to schedule. Otherwise they will reach out to her.   Once MRI is completed and care is est with psych, Dr Andree Elk office will be able to schedule with pt for pain management.

## 2016-01-09 NOTE — Telephone Encounter (Signed)
I have placed a referral for psych as well as an MRI.

## 2016-01-15 ENCOUNTER — Telehealth: Payer: Self-pay

## 2016-01-15 ENCOUNTER — Ambulatory Visit (HOSPITAL_COMMUNITY): Admission: RE | Admit: 2016-01-15 | Payer: Medicare Other | Source: Ambulatory Visit

## 2016-01-15 NOTE — Telephone Encounter (Signed)
Patient would like to see if we can reschedule the MRI. Patient states she can't make it today due to a death in her family. Please advise!  [phone]

## 2016-01-15 NOTE — Telephone Encounter (Signed)
Informed pt that she must call [facility] in order to reschedule.

## 2016-01-19 ENCOUNTER — Ambulatory Visit: Payer: Medicare Other | Admitting: Gynecology

## 2016-01-22 ENCOUNTER — Ambulatory Visit (HOSPITAL_COMMUNITY)
Admission: RE | Admit: 2016-01-22 | Discharge: 2016-01-22 | Disposition: A | Discharge status: Home / Self Care | Payer: Medicare Other | Source: Ambulatory Visit | Attending: Physician Assistant | Admitting: Physician Assistant

## 2016-01-22 DIAGNOSIS — Z981 Arthrodesis status: Secondary | ICD-10-CM | POA: Insufficient documentation

## 2016-01-22 DIAGNOSIS — Z9889 Other specified postprocedural states: Secondary | ICD-10-CM | POA: Insufficient documentation

## 2016-01-22 DIAGNOSIS — M5136 Other intervertebral disc degeneration, lumbar region: Secondary | ICD-10-CM | POA: Insufficient documentation

## 2016-01-22 DIAGNOSIS — M5442 Lumbago with sciatica, left side: Secondary | ICD-10-CM | POA: Diagnosis present

## 2016-01-22 DIAGNOSIS — M545 Low back pain: Secondary | ICD-10-CM | POA: Diagnosis not present

## 2016-01-22 DIAGNOSIS — G8929 Other chronic pain: Secondary | ICD-10-CM

## 2016-01-25 ENCOUNTER — Ambulatory Visit (INDEPENDENT_AMBULATORY_CARE_PROVIDER_SITE_OTHER): Payer: Medicare Other | Admitting: Physician Assistant

## 2016-01-25 ENCOUNTER — Ambulatory Visit: Payer: Medicare Other | Admitting: Physician Assistant

## 2016-01-25 ENCOUNTER — Encounter: Payer: Self-pay | Admitting: Physician Assistant

## 2016-01-25 VITALS — BP 108/80 | HR 102 | Temp 99.1°F | Resp 16 | Ht 66.5 in | Wt 138.8 lb

## 2016-01-25 DIAGNOSIS — G4709 Other insomnia: Secondary | ICD-10-CM | POA: Diagnosis not present

## 2016-01-25 DIAGNOSIS — J441 Chronic obstructive pulmonary disease with (acute) exacerbation: Secondary | ICD-10-CM

## 2016-01-25 DIAGNOSIS — R062 Wheezing: Secondary | ICD-10-CM | POA: Diagnosis not present

## 2016-01-25 LAB — POCT URINE PREGNANCY: PREG TEST UR: NEGATIVE

## 2016-01-25 MED ORDER — ALBUTEROL SULFATE (2.5 MG/3ML) 0.083% IN NEBU
2.5000 mg | INHALATION_SOLUTION | Freq: Once | RESPIRATORY_TRACT | Status: AC
  Administered 2016-01-25: 2.5 mg via RESPIRATORY_TRACT

## 2016-01-25 MED ORDER — PREDNISONE 20 MG PO TABS: ORAL_TABLET | ORAL | 0 refills | Status: AC

## 2016-01-25 MED ORDER — SUVOREXANT 15 MG PO TABS: 15.0000 mg | ORAL_TABLET | Freq: Every day | ORAL | 3 refills | Status: DC

## 2016-01-25 MED ORDER — IPRATROPIUM BROMIDE 0.02 % IN SOLN
0.5000 mg | Freq: Once | RESPIRATORY_TRACT | Status: AC
  Administered 2016-01-25: 0.5 mg via RESPIRATORY_TRACT

## 2016-01-25 MED ORDER — DOXYCYCLINE HYCLATE 100 MG PO CAPS: 100.0000 mg | ORAL_CAPSULE | Freq: Two times a day (BID) | ORAL | 0 refills | Status: AC

## 2016-01-25 NOTE — Progress Notes (Signed)
Can we please forward this result to Dr. [facility]k at [facility]? He had requested this image be complete before he is willing to take the patient. [name], MS, PA-C 3:05 PM, 01/25/2016

## 2016-01-25 NOTE — Patient Instructions (Addendum)
Come back in about 1 month for breathing studies.      IF you received an x-ray today, you will receive an invoice from [facility]. Please contact [facility] at [phone] with questions or concerns regarding your invoice.   IF you received labwork today, you will receive an invoice from [facility]. Please contact [facility] at [phone] with questions or concerns regarding your invoice.   Our billing staff will not be able to assist you with questions regarding bills from these companies.  You will be contacted with the lab results as soon as they are available. The fastest way to get your results is to activate your My Chart account. Instructions are located on the last page of this paperwork. If you have not heard from [place] regarding the results in 2 weeks, please contact this office.

## 2016-01-25 NOTE — Progress Notes (Signed)
01/26/2016 2:01 PM   DOB: 10-22-1970 / MRN: [id number]  SUBJECTIVE:  Rita Hunter is a 45 y.o. female presenting for worsening cough that is dry that started about a week ago.  She has a history of COPD for about 5 years now.  Assoicates nasal congestion, sore throat. She has been taking OTC cough syrup.  She is taking QVAR 80 bid.  She denies a history of diabetes.   She has started the chantix and has cut down to 4 cigarrettes a day.    Complains that the trazodone did not help her insomnia and she would like to try a different medication.   Depression screen PHQ 2/9 01/25/2016  Decreased Interest 3  Down, Depressed, Hopeless 3  PHQ - 2 Score 6  Altered sleeping 3  Tired, decreased energy 3  Change in appetite 2  Feeling bad or failure about yourself  1  Trouble concentrating 2  Moving slowly or fidgety/restless 2  Suicidal thoughts 0  PHQ-9 Score 19  Difficult doing work/chores -     She is allergic to mobic [meloxicam] and ultram [tramadol hcl].   She  has a past medical history of Allergy; Anxiety; Asthma; COPD (chronic obstructive pulmonary disease) ([place]); Depression; Emphysema of lung ([place]); and Neuromuscular disorder ([place]).    She  reports that she has been smoking.  She has never used smokeless tobacco. She reports that she does not drink alcohol or use drugs. She  has no sexual activity history on file. The patient  has a past surgical history that includes Hernia repair.  Her family history includes Diabetes in her mother and sister; Heart disease in her mother and sister; Hyperlipidemia in her father and sister; Mental illness in her mother; Stroke in her father and sister.  Review of Systems  Constitutional: Negative for chills and fever.  Skin: Negative for itching and rash.  Neurological: Negative for dizziness.    The problem list and medications were reviewed and updated by myself where necessary and exist elsewhere in the encounter.   OBJECTIVE:  BP  108/80 (BP Location: Right Arm, Patient Position: Sitting, Cuff Size: Normal)   Pulse (!) 102   Temp 99.1 F (37.3 C) (Oral)   Resp 16   Ht 5' 6.5" (1.689 m)   Wt 138 lb 12.8 oz (63 kg)   LMP 12/17/2015   SpO2 96%   BMI 22.07 kg/m   Pulse Readings from Last 3 Encounters:  01/25/16 (!) 102  12/29/15 (!) 102  12/20/15 90     Physical Exam  Constitutional: She is oriented to person, place, and time.  HENT:  Right Ear: External ear normal.  Left Ear: External ear normal.  Nose: Mucosal edema present. Right sinus exhibits no maxillary sinus tenderness and no frontal sinus tenderness. Left sinus exhibits no maxillary sinus tenderness and no frontal sinus tenderness.  Mouth/Throat: Oropharynx is clear and moist. No oropharyngeal exudate.  Eyes: Conjunctivae are normal. Pupils are equal, round, and reactive to light.  Cardiovascular: Regular rhythm and normal heart sounds.   Pulmonary/Chest: Effort normal. No respiratory distress. She has wheezes (faint, generalized). She has no rales. She exhibits no tenderness.  Neurological: She is alert and oriented to person, place, and time.  Skin: Skin is warm and dry. No rash noted. She is not diaphoretic. No erythema.  Psychiatric: Her behavior is normal.    Results for orders placed or performed in visit on 01/25/16 (from the past 72 hour(s))  POCT urine pregnancy  Status: None   Collection Time: 01/25/16  5:07 PM  Result Value Ref Range   Preg Test, Ur Negative Negative    No results found.   ASSESSMENT AND PLAN  Rita Hunter was seen today for cough, sore throat and depression.  Diagnoses and all orders for this visit:  COPD exacerbation ([place]): Symptoms present for about 1 week now and she is worsening.  Given her documented history of COPD I am going to treat her for a COPD flare.  -     predniSONE (DELTASONE) 20 MG tablet; Take 3 in the morning for 3 days, then 2 in the morning for 3 days, and then 1 in the morning for 3 days. -      doxycycline (VIBRAMYCIN) 100 MG capsule; Take 1 capsule (100 mg total) by mouth 2 (two) times daily. -     POCT urine pregnancy  Wheezing -     albuterol (PROVENTIL) (2.5 MG/3ML) 0.083% nebulizer solution 2.5 mg; Take 3 mLs (2.5 mg total) by nebulization once. -     ipratropium (ATROVENT) nebulizer solution 0.5 mg; Take 2.5 mLs (0.5 mg total) by nebulization once.  Other insomnia -     Suvorexant (BELSOMRA) 15 MG TABS; Take 15 mg by mouth at bedtime.    The patient is advised to call or return to clinic if she does not see an improvement in symptoms, or to seek the care of the closest emergency department if she worsens with the above plan.   [name], MHS, PA-C [facility] 01/26/2016 2:01 PM

## 2016-01-26 NOTE — Progress Notes (Signed)
Will fax to Dr Andree Elk today

## 2016-02-06 ENCOUNTER — Telehealth: Payer: Self-pay

## 2016-02-06 DIAGNOSIS — M545 Low back pain: Principal | ICD-10-CM

## 2016-02-06 DIAGNOSIS — G8929 Other chronic pain: Secondary | ICD-10-CM

## 2016-02-06 NOTE — Telephone Encounter (Signed)
Pt is needing a refill on hydrocodone  Best number [phone]

## 2016-02-08 MED ORDER — HYDROCODONE-ACETAMINOPHEN 10-325 MG PO TABS: 1.0000 | ORAL_TABLET | Freq: Four times a day (QID) | ORAL | 0 refills | Status: DC | PRN

## 2016-02-08 NOTE — Telephone Encounter (Signed)
Last OV for pain 11/17 w/Michael, last refill 11/22.

## 2016-02-16 ENCOUNTER — Ambulatory Visit: Payer: Medicare Other | Admitting: Gynecology

## 2016-02-16 DIAGNOSIS — Z0289 Encounter for other administrative examinations: Secondary | ICD-10-CM

## 2016-02-26 ENCOUNTER — Other Ambulatory Visit: Payer: Self-pay | Admitting: Physician Assistant

## 2016-02-26 ENCOUNTER — Ambulatory Visit: Payer: Medicare Other | Admitting: Physician Assistant

## 2016-02-26 DIAGNOSIS — G2581 Restless legs syndrome: Secondary | ICD-10-CM

## 2016-02-27 ENCOUNTER — Ambulatory Visit: Payer: Medicare Other

## 2016-02-29 NOTE — Telephone Encounter (Signed)
Last ov 01/2016 Last refill 12/2015 with 1 refill

## 2016-03-04 ENCOUNTER — Ambulatory Visit (INDEPENDENT_AMBULATORY_CARE_PROVIDER_SITE_OTHER): Payer: Medicare Other | Admitting: Physician Assistant

## 2016-03-04 ENCOUNTER — Encounter: Payer: Self-pay | Admitting: Physician Assistant

## 2016-03-04 VITALS — BP 121/77 | HR 89 | Temp 98.7°F | Ht 66.5 in | Wt 137.6 lb

## 2016-03-04 DIAGNOSIS — F17200 Nicotine dependence, unspecified, uncomplicated: Secondary | ICD-10-CM | POA: Diagnosis not present

## 2016-03-04 DIAGNOSIS — M545 Low back pain: Secondary | ICD-10-CM | POA: Diagnosis not present

## 2016-03-04 DIAGNOSIS — G8929 Other chronic pain: Secondary | ICD-10-CM | POA: Diagnosis not present

## 2016-03-04 DIAGNOSIS — J449 Chronic obstructive pulmonary disease, unspecified: Secondary | ICD-10-CM

## 2016-03-04 DIAGNOSIS — Z23 Encounter for immunization: Secondary | ICD-10-CM

## 2016-03-04 MED ORDER — NICOTINE 10 MG IN INHA: 1.0000 | RESPIRATORY_TRACT | 0 refills | Status: DC | PRN

## 2016-03-04 MED ORDER — FLUTICASONE-SALMETEROL 115-21 MCG/ACT IN AERO: 2.0000 | INHALATION_SPRAY | Freq: Two times a day (BID) | RESPIRATORY_TRACT | 11 refills | Status: DC

## 2016-03-04 MED ORDER — ALBUTEROL SULFATE (2.5 MG/3ML) 0.083% IN NEBU
2.5000 mg | INHALATION_SOLUTION | RESPIRATORY_TRACT | Status: AC
  Administered 2016-03-04: 2.5 mg via RESPIRATORY_TRACT

## 2016-03-04 MED ORDER — ALBUTEROL SULFATE (2.5 MG/3ML) 0.083% IN NEBU: 5.0000 mg | INHALATION_SOLUTION | Freq: Once | RESPIRATORY_TRACT | Status: DC

## 2016-03-04 MED ORDER — HYDROCODONE-ACETAMINOPHEN 10-325 MG PO TABS: 1.0000 | ORAL_TABLET | Freq: Four times a day (QID) | ORAL | 0 refills | Status: DC | PRN

## 2016-03-04 MED ORDER — ALBUTEROL SULFATE HFA 108 (90 BASE) MCG/ACT IN AERS: 2.0000 | INHALATION_SPRAY | RESPIRATORY_TRACT | 1 refills | Status: DC | PRN

## 2016-03-04 NOTE — Patient Instructions (Signed)
     IF you received an x-ray today, you will receive an invoice from [facility]. Please contact [facility] at [phone] with questions or concerns regarding your invoice.   IF you received labwork today, you will receive an invoice from [facility]. Please contact [facility] at [phone] with questions or concerns regarding your invoice.   Our billing staff will not be able to assist you with questions regarding bills from these companies.  You will be contacted with the lab results as soon as they are available. The fastest way to get your results is to activate your My Chart account. Instructions are located on the last page of this paperwork. If you have not heard from us regarding the results in 2 weeks, please contact this office.     

## 2016-03-04 NOTE — Progress Notes (Signed)
03/04/2016 6:38 PM   DOB: 03/01/1970 / MRN: [id number]  SUBJECTIVE:  Rita Hunter is a 46 y.o. female presenting for COPD.  She has been smoking for roughly 15 years and smoked a 1.5 packs during then.  She smokes about 3-4 cigarettes daily. She takes Spiriva, proair. She can not afford QVAR. She was managed by a pulmonary MD in [place] however she prefers to avoid referral if possible. She is compliant with medications as long as they are on her insurance program.   [facility] and has an appointment scheduled in roughly 2 months and this is first available.  She would like to get in with Dr[facility] Quita [facility] at [facility] however will see anyone until she can establish with psych.   Depression screen Rita Hunter 2/9 03/04/2016  Decreased Interest 3  Down, Depressed, Hopeless 3  PHQ - 2 Score 6  Altered sleeping 3  Tired, decreased energy 1  Change in appetite 1  Feeling bad or failure about yourself  1  Trouble concentrating 1  Moving slowly or fidgety/restless 0  Suicidal thoughts 0  PHQ-9 Score 13  Difficult doing work/chores Very difficult    She is allergic to mobic [meloxicam] and ultram [tramadol hcl].   She  has a past medical history of Allergy; Anxiety; Asthma; COPD (chronic obstructive pulmonary disease) (Rita Hunter); Depression; Emphysema of lung (Rita Hunter); and Neuromuscular disorder (Rita Hunter).    She  reports that she has been smoking.  She has never used smokeless tobacco. She reports that she does not drink alcohol or use drugs. She  has no sexual activity history on file. The patient  has a past surgical history that includes Hernia repair.  Her family history includes Diabetes in her mother and sister; Heart disease in her mother and sister; Hyperlipidemia in her father and sister; Mental illness in her mother; Stroke in her father and sister.  Review of Systems  Constitutional: Negative for chills and fever.  Gastrointestinal:  Negative for nausea.  Genitourinary: Negative for dysuria.  Skin: Negative for itching and rash.  Neurological: Negative for dizziness.    The problem list and medications were reviewed and updated by myself where necessary and exist elsewhere in the encounter.   OBJECTIVE:  BP 121/77 (BP Location: Right Arm, Patient Position: Sitting, Cuff Size: Small)   Pulse 89   Temp 98.7 F (37.1 C) (Oral)   Ht 5' 6.5" (1.689 m)   Wt 137 lb 9.6 oz (62.4 kg)   LMP 02/25/2016 (Approximate)   SpO2 98%   BMI 21.88 kg/m   Physical Exam  Cardiovascular: Normal rate and regular rhythm.   Pulmonary/Chest: Effort normal and breath sounds normal. No respiratory distress. She has no wheezes. She has no rales. She exhibits no tenderness.  Musculoskeletal: Normal range of motion.  Neurological: No cranial nerve deficit.   Office Spirometry Results: FEV1: 2.13 liters FVC: 3.23 liters FEV1/FVC: 65.9 % FVC  % Predicted: 82 liters FEV % Predicted: 67 liters FeF 25-75: 1.2 liters FeF 25-75 % Predicted: 39   No results found for this or any previous visit (from the past 72 hour(s)).  No results found.  ASSESSMENT AND PLAN:  [name] was seen today for follow-up.  Diagnoses and all orders for this visit:  Chronic midline low back pain without sciatica: I am continuing narcotic therapy for the month of February.  I have advised that I will no longer be able to prescribe after  this month as I am not a chronic pain specialist.  -     HYDROcodone-acetaminophen (NORCO) 10-325 MG tablet; Take 1 tablet by mouth every 6 (six) hours as needed for severe pain. No refills after January 2018.  Chronic obstructive pulmonary disease, unspecified COPD type ([place]): FEV one showing moderate disease. She has done well on advair in the past.  Will restart this.  -     fluticasone-salmeterol (ADVAIR HFA) 115-21 MCG/ACT inhaler; Inhale 2 puffs into the lungs 2 (two) times daily. -     Discontinue: albuterol (PROVENTIL)  (2.5 MG/3ML) 0.083% nebulizer solution 5 mg; Take 6 mLs (5 mg total) by nebulization once. -     albuterol (PROVENTIL) (2.5 MG/3ML) 0.083% nebulizer solution 2.5 mg; Take 3 mLs (2.5 mg total) by nebulization now. -     Ambulatory referral to Pain Clinic  Current smoker: Will try her on nicotrol inhaler.     The patient is advised to call or return to clinic if she does not see an improvement in symptoms, or to seek the care of the closest emergency department if she worsens with the above plan.   [name], MHS, PA-C [facility] 03/04/2016 6:38 PM

## 2016-03-13 ENCOUNTER — Telehealth: Payer: Self-pay

## 2016-03-13 ENCOUNTER — Encounter (HOSPITAL_COMMUNITY): Payer: Self-pay

## 2016-03-13 NOTE — Telephone Encounter (Signed)
Please advise 

## 2016-03-13 NOTE — Telephone Encounter (Signed)
Pt was on Fioricet x 3 years while in [place] and it worked great, would like an rx.  I advised because we have not seen her for migraines she may need an appt. Please advise.

## 2016-03-13 NOTE — Telephone Encounter (Signed)
Pt is requesting that something be called in for her migraines  Best number [phone]

## 2016-03-14 NOTE — Telephone Encounter (Signed)
L/m with clarks note

## 2016-03-14 NOTE — Telephone Encounter (Signed)
Ill need to see her.  She has a follow up in a month. [name], MS, PA-C 12:04 PM, 03/14/2016

## 2016-04-04 ENCOUNTER — Telehealth: Payer: Self-pay

## 2016-04-04 NOTE — Telephone Encounter (Signed)
Pharmacy notified us qvar hfa being d/c by manufacturer New rx will be qvar hfa redihaler.  please change this in chart if appropriate for future refills

## 2016-04-04 NOTE — Telephone Encounter (Signed)
Reroute to [name] as he is the PCP

## 2016-04-04 NOTE — Telephone Encounter (Signed)
Pt states she had received a call from our office and is giving [place] a call back.  She is unsure of who called her and I didn't see documentation of a call in her chart.  Please advise  [phone]

## 2016-04-08 ENCOUNTER — Ambulatory Visit (INDEPENDENT_AMBULATORY_CARE_PROVIDER_SITE_OTHER): Payer: Medicare Other | Admitting: Physician Assistant

## 2016-04-08 ENCOUNTER — Encounter: Payer: Self-pay | Admitting: Physician Assistant

## 2016-04-08 VITALS — BP 125/73 | HR 99 | Temp 99.0°F | Resp 16 | Ht 66.0 in | Wt 140.0 lb

## 2016-04-08 DIAGNOSIS — G8929 Other chronic pain: Secondary | ICD-10-CM | POA: Diagnosis not present

## 2016-04-08 DIAGNOSIS — F17200 Nicotine dependence, unspecified, uncomplicated: Secondary | ICD-10-CM

## 2016-04-08 DIAGNOSIS — Z136 Encounter for screening for cardiovascular disorders: Secondary | ICD-10-CM

## 2016-04-08 DIAGNOSIS — M545 Low back pain: Secondary | ICD-10-CM

## 2016-04-08 DIAGNOSIS — Z23 Encounter for immunization: Secondary | ICD-10-CM

## 2016-04-08 DIAGNOSIS — R110 Nausea: Secondary | ICD-10-CM | POA: Diagnosis not present

## 2016-04-08 DIAGNOSIS — G2581 Restless legs syndrome: Secondary | ICD-10-CM | POA: Diagnosis not present

## 2016-04-08 MED ORDER — GABAPENTIN 600 MG PO TABS: 600.0000 mg | ORAL_TABLET | Freq: Three times a day (TID) | ORAL | 1 refills | Status: DC

## 2016-04-08 MED ORDER — VARENICLINE TARTRATE 1 MG PO TABS: 1.0000 mg | ORAL_TABLET | Freq: Two times a day (BID) | ORAL | 2 refills | Status: DC

## 2016-04-08 MED ORDER — HYDROCODONE-ACETAMINOPHEN 10-325 MG PO TABS: 1.0000 | ORAL_TABLET | Freq: Four times a day (QID) | ORAL | 0 refills | Status: DC | PRN

## 2016-04-08 MED ORDER — ROPINIROLE HCL 1 MG PO TABS: 0.5000 mg | ORAL_TABLET | Freq: Every day | ORAL | 1 refills | Status: DC

## 2016-04-08 MED ORDER — PROMETHAZINE HCL 12.5 MG PO TABS: 12.5000 mg | ORAL_TABLET | Freq: Three times a day (TID) | ORAL | 1 refills | Status: DC | PRN

## 2016-04-08 NOTE — Patient Instructions (Signed)
     IF you received an x-ray today, you will receive an invoice from [facility]. Please contact [facility] at [phone] with questions or concerns regarding your invoice.   IF you received labwork today, you will receive an invoice from [facility]. Please contact [facility] at [phone] with questions or concerns regarding your invoice.   Our billing staff will not be able to assist you with questions regarding bills from these companies.  You will be contacted with the lab results as soon as they are available. The fastest way to get your results is to activate your My Chart account. Instructions are located on the last page of this paperwork. If you have not heard from us regarding the results in 2 weeks, please contact this office.     

## 2016-04-08 NOTE — Progress Notes (Signed)
04/12/2016 10:19 AM   DOB: [birth year] / MRN: [id number]  SUBJECTIVE:  Rita Hunter is a 46 y.o. female presenting for a pneumonia shot and a refill of her pain medication.  Has an appointment with psych on March 5th. She will be seeing Dr. Quita Skye in pain management hopefully by the of the month. She has a long history of peripheral neuropathy 2/2 to lumbar disk disease and is also taking gabapenitn.   She does take Lopressor and she thinks this is for HTN.   Complains of nausea with chantix [place].  Would like to try phenergan for nausea as this has worked well for her in the past.    She is allergic to mobic [meloxicam] and ultram [tramadol hcl].   She  has a past medical history of Allergy; Anxiety; Asthma; COPD (chronic obstructive pulmonary disease) ([place]); Depression; Emphysema of lung ([place]); and Neuromuscular disorder ([place]).    She  reports that she has been smoking.  She has never used smokeless tobacco. She reports that she does not drink alcohol or use drugs. She  has no sexual activity history on file. The patient  has a past surgical history that includes Hernia repair.  Her family history includes Diabetes in her mother and sister; Heart disease in her mother and sister; Hyperlipidemia in her father and sister; Mental illness in her mother; Stroke in her father and sister.  Review of Systems  Respiratory: Positive for shortness of breath (not new). Negative for cough.   Cardiovascular: Negative for chest pain and leg swelling.  Gastrointestinal: Positive for nausea.  Neurological: Negative for dizziness.    The problem list and medications were reviewed and updated by myself where necessary and exist elsewhere in the encounter.   OBJECTIVE:  BP 125/73   Pulse 99   Temp 99 F (37.2 C) (Oral)   Resp 16   Ht 5\' 6"  (1.676 m)   Wt 140 lb (63.5 kg)   LMP 04/04/2016   SpO2 96%   BMI 22.60 kg/m   Physical Exam  Constitutional: She is oriented to person, place, and time. She  appears well-nourished. No distress.  Eyes: EOM are normal. Pupils are equal, round, and reactive to light.  Cardiovascular: Normal rate, regular rhythm and normal heart sounds.   Pulmonary/Chest: Effort normal and breath sounds normal.  Abdominal: She exhibits no distension.  Neurological: She is alert and oriented to person, place, and time. No cranial nerve deficit. Gait normal.  Skin: Skin is dry. She is not diaphoretic.  Psychiatric: She has a normal mood and affect.  Vitals reviewed.   No results found for this or any previous visit (from the past 72 hour(s)).  No results found.  ASSESSMENT AND PLAN:  [name] was seen today for follow-up.  Diagnoses and all orders for this visit:  Chronic midline low back pain without sciatica Comments: She has an appointment with psych coming up and should be establishing with Dr. Andree Elk in pain shortly thereafter.  I will maintain narcotics until that time.  Orders: -     HYDROcodone-acetaminophen (NORCO) 10-325 MG tablet; Take 1 tablet by mouth every 6 (six) hours as needed for severe pain. No refills after January 2018.  Smoking Comments: She continues to try and limit her usage and feels the chantix has helped. Will maintain.  Orders: -     varenicline (CHANTIX CONTINUING MONTH PAK) 1 MG tablet; Take 1 tablet (1 mg total) by mouth 2 (two) times daily.  Nausea without vomiting Comments:  Likely 2/2 to chantix andministration.  Will start phenergan.   Orders: -     promethazine (PHENERGAN) 12.5 MG tablet; Take 1 tablet (12.5 mg total) by mouth every 8 (eight) hours as needed for nausea or vomiting.  Need for prophylactic vaccination against Streptococcus pneumoniae (pneumococcus) Comments: Will add prevnar in 1 year given COPD.  Orders: -     Pneumococcal polysaccharide vaccine 23-valent greater than or equal to 2yo subcutaneous/IM  RLS (restless legs syndrome) Comments: Well maintained with low dose ropinirole.  Orders: -      rOPINIRole (REQUIP) 1 MG tablet; Take 0.5-1 tablets (0.5-1 mg total) by mouth at bedtime.  Screening for cardiovascular condition Comments: EKG NSR with normal axis and without signs of hypertrophy, ischemia and infaction.  Orders: -     EKG 12-Lead  Other orders -     gabapentin (NEURONTIN) 600 MG tablet; Take 1 tablet (600 mg total) by mouth 3 (three) times daily.    The patient is advised to call or return to clinic if she does not see an improvement in symptoms, or to seek the care of the closest emergency department if she worsens with the above plan.   [name], MHS, PA-C [facility] 04/12/2016 10:19 AM

## 2016-04-15 ENCOUNTER — Ambulatory Visit (HOSPITAL_COMMUNITY): Payer: Self-pay | Admitting: Psychiatry

## 2016-05-02 ENCOUNTER — Other Ambulatory Visit: Payer: Self-pay | Admitting: Physician Assistant

## 2016-05-02 DIAGNOSIS — G8929 Other chronic pain: Secondary | ICD-10-CM

## 2016-05-02 DIAGNOSIS — M545 Low back pain: Principal | ICD-10-CM

## 2016-05-02 NOTE — Telephone Encounter (Signed)
Pt calling for refill on Norco. States that she is still waiting to get in to pain management as she has to see psych first.

## 2016-05-03 NOTE — Telephone Encounter (Signed)
04/08/16 last ov and refill

## 2016-05-07 NOTE — Telephone Encounter (Signed)
I was under the impression she had enough medication from her previous prescription to get to psych and to pain management.  I will fill this when I come in tomorrow.  Prescription should be ready at about 1:30.  Please call her and let her know. [name], MS, PA-C 9:11 PM, 05/07/2016

## 2016-05-07 NOTE — Telephone Encounter (Signed)
Pt calling again about refill on Norco she states that she is completely out and her appointment with a psych is not until 05-22-16 pt is upset please respond

## 2016-05-08 MED ORDER — HYDROCODONE-ACETAMINOPHEN 10-325 MG PO TABS: 1.0000 | ORAL_TABLET | Freq: Four times a day (QID) | ORAL | 0 refills | Status: DC | PRN

## 2016-05-09 NOTE — Telephone Encounter (Signed)
Picked up 05/08/16

## 2016-05-15 ENCOUNTER — Encounter: Payer: Self-pay | Admitting: Physician Assistant

## 2016-05-15 ENCOUNTER — Ambulatory Visit (INDEPENDENT_AMBULATORY_CARE_PROVIDER_SITE_OTHER): Payer: Medicare Other | Admitting: Physician Assistant

## 2016-05-15 VITALS — BP 144/92 | HR 94 | Temp 98.3°F | Resp 16 | Ht 66.0 in | Wt 143.8 lb

## 2016-05-15 DIAGNOSIS — G8929 Other chronic pain: Secondary | ICD-10-CM

## 2016-05-15 DIAGNOSIS — M5442 Lumbago with sciatica, left side: Secondary | ICD-10-CM

## 2016-05-15 MED ORDER — PREDNISONE 20 MG PO TABS: ORAL_TABLET | ORAL | 0 refills | Status: DC

## 2016-05-15 NOTE — Patient Instructions (Signed)
     IF you received an x-ray today, you will receive an invoice from [facility]. Please contact [facility] at [phone] with questions or concerns regarding your invoice.   IF you received labwork today, you will receive an invoice from [facility]. Please contact [facility] at [phone] with questions or concerns regarding your invoice.   Our billing staff will not be able to assist you with questions regarding bills from these companies.  You will be contacted with the lab results as soon as they are available. The fastest way to get your results is to activate your My Chart account. Instructions are located on the last page of this paperwork. If you have not heard from us regarding the results in 2 weeks, please contact this office.     

## 2016-05-15 NOTE — Progress Notes (Signed)
05/15/2016 5:10 PM   DOB: 1970-10-20 / MRN: [id number]  SUBJECTIVE:  Rita Hunter is a 46 y.o. female presenting for acute worsening of left lower back pain that started two days ago.  She has chronic numbness in the leg and says this has not changed. She has tried a heating pad which has helped some along with narcotic pain medication that I prescribe her. She has tried Ibuprofen along with a muscle relaxer, both of which have helped some.Her normal pain is a 7/10 and today tells me this is a nine.   She has an appointment with psysch on April the 11th, which doctor Andree Elk is requiring before he will see her in the pain clinic.    She is allergic to mobic [meloxicam] and ultram [tramadol hcl].   She  has a past medical history of Allergy; Anxiety; Asthma; COPD (chronic obstructive pulmonary disease) ([place]); Depression; Emphysema of lung ([place]); and Neuromuscular disorder ([place]).    She  reports that she has been smoking.  She has never used smokeless tobacco. She reports that she does not drink alcohol or use drugs. She  has no sexual activity history on file. The patient  has a past surgical history that includes Hernia repair.  Her family history includes Diabetes in her mother and sister; Heart disease in her mother and sister; Hyperlipidemia in her father and sister; Mental illness in her mother; Stroke in her father and sister.  Review of Systems  Constitutional: Negative for chills, diaphoresis and fever.  Gastrointestinal: Negative for abdominal pain, blood in stool, constipation, diarrhea, heartburn, melena, nausea and vomiting.  Genitourinary: Negative for dysuria, flank pain, frequency, hematuria and urgency.  Musculoskeletal: Positive for back pain and myalgias. Negative for falls, joint pain and neck pain.  Skin: Negative for rash.  Neurological: Negative for dizziness.    The problem list and medications were reviewed and updated by myself where necessary and exist elsewhere in the  encounter.   OBJECTIVE:  BP (!) 144/92 (BP Location: Right Arm, Patient Position: Sitting, Cuff Size: Small)   Pulse 94   Temp 98.3 F (36.8 C) (Oral)   Resp 16   Ht 5\' 6"  (1.676 m)   Wt 143 lb 12.8 oz (65.2 kg)   LMP 05/06/2016   SpO2 98%   BMI 23.21 kg/m   BP Readings from Last 3 Encounters:  05/15/16 (!) 144/92  04/08/16 125/73  03/04/16 121/77   Physical Exam  Constitutional: She is oriented to person, place, and time. She is active.  Non-toxic appearance.  Eyes: EOM are normal. Pupils are equal, round, and reactive to light.  Cardiovascular: Normal rate.   Pulmonary/Chest: Effort normal. No tachypnea.  Musculoskeletal: She exhibits tenderness. She exhibits no deformity. Edema: left paraspinals.       Lumbar back: She exhibits tenderness, pain and spasm. She exhibits no bony tenderness.  Neurological: She is alert and oriented to person, place, and time. She has normal strength and normal reflexes. She is not disoriented. She displays no atrophy. No cranial nerve deficit or sensory deficit. She exhibits normal muscle tone. Coordination and gait normal.  Skin: Skin is warm and dry. She is not diaphoretic. No pallor.  Psychiatric: Her behavior is normal.    Lab Results  Component Value Date   CREATININE 0.70 12/20/2015   No results found for this or any previous visit (from the past 72 hour(s)).  No results found.  ASSESSMENT AND PLAN:  Rita Hunter was seen today for back pain.  Diagnoses  and all orders for this visit:  Chronic left-sided low back pain with left-sided sciatica -     AMB referral to orthopedics  Acute left-sided low back pain with left-sided sciatica: She will continue her current pain management plan. She will try using her sister's Tens Unit.   Adding prednisone to her regimen. Heat and ice as needed.  She should hopefully be established with Dr. Andree Elk in pain in the next 60 days.  -     predniSONE (DELTASONE) 20 MG tablet; Take 3 in the morning for 3  days, then 2 in the morning for 3 days, and then 1 in the morning for 3 days.    The patient is advised to call or return to clinic if she does not see an improvement in symptoms, or to seek the care of the closest emergency department if she worsens with the above plan.   [name], MHS, PA-C [facility] 05/15/2016 5:10 PM

## 2016-05-22 ENCOUNTER — Ambulatory Visit (INDEPENDENT_AMBULATORY_CARE_PROVIDER_SITE_OTHER): Payer: Medicare Other | Admitting: Psychiatry

## 2016-05-22 ENCOUNTER — Encounter (HOSPITAL_COMMUNITY): Payer: Self-pay | Admitting: Psychiatry

## 2016-05-22 VITALS — BP 142/74 | HR 81 | Ht 66.0 in | Wt 147.0 lb

## 2016-05-22 DIAGNOSIS — F200 Paranoid schizophrenia: Secondary | ICD-10-CM

## 2016-05-22 DIAGNOSIS — Z818 Family history of other mental and behavioral disorders: Secondary | ICD-10-CM

## 2016-05-22 DIAGNOSIS — F4310 Post-traumatic stress disorder, unspecified: Secondary | ICD-10-CM | POA: Diagnosis not present

## 2016-05-22 DIAGNOSIS — F17210 Nicotine dependence, cigarettes, uncomplicated: Secondary | ICD-10-CM

## 2016-05-22 DIAGNOSIS — Z79899 Other long term (current) drug therapy: Secondary | ICD-10-CM | POA: Diagnosis not present

## 2016-05-22 MED ORDER — DULOXETINE HCL 20 MG PO CPEP: ORAL_CAPSULE | ORAL | 1 refills | Status: DC

## 2016-05-22 MED ORDER — DIVALPROEX SODIUM 250 MG PO DR TAB: 250.0000 mg | DELAYED_RELEASE_TABLET | Freq: Every day | ORAL | 1 refills | Status: DC
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
## 2016-05-23 ENCOUNTER — Other Ambulatory Visit: Payer: Self-pay | Admitting: Physician Assistant

## 2016-05-23 ENCOUNTER — Other Ambulatory Visit: Payer: Self-pay | Admitting: Family Medicine

## 2016-05-24 ENCOUNTER — Telehealth: Payer: Self-pay | Admitting: Physician Assistant

## 2016-05-24 ENCOUNTER — Other Ambulatory Visit: Payer: Self-pay | Admitting: Physician Assistant

## 2016-05-24 NOTE — Telephone Encounter (Signed)
Yes I write this for her.  I have refilled via orders only encounter. [name], MS, PA-C 3:52 PM, 05/24/2016

## 2016-05-24 NOTE — Telephone Encounter (Signed)
Do you rx this?

## 2016-05-24 NOTE — Telephone Encounter (Signed)
Up date.  Psych stopped the sleeping medication.  We need to follow there recommendations. I will not prescribe. [name], MS, PA-C 4:31 PM, 05/24/2016

## 2016-05-24 NOTE — Telephone Encounter (Signed)
12/2015 last refill 05/2016 last ov

## 2016-05-24 NOTE — Telephone Encounter (Signed)
Pharmacy calling following up on Suvorexant from 3/21 and April 6th. Please advise. Pharmacy callback is [phone].

## 2016-05-24 NOTE — Telephone Encounter (Signed)
Denied to divydose who requested

## 2016-06-03 ENCOUNTER — Other Ambulatory Visit: Payer: Self-pay | Admitting: Physician Assistant

## 2016-06-03 ENCOUNTER — Telehealth: Payer: Self-pay | Admitting: Physician Assistant

## 2016-06-03 DIAGNOSIS — M545 Low back pain, unspecified: Secondary | ICD-10-CM

## 2016-06-03 DIAGNOSIS — G8929 Other chronic pain: Secondary | ICD-10-CM

## 2016-06-03 MED ORDER — HYDROCODONE-ACETAMINOPHEN 10-325 MG PO TABS: 1.0000 | ORAL_TABLET | Freq: Four times a day (QID) | ORAL | 0 refills | Status: DC | PRN

## 2016-06-03 NOTE — Telephone Encounter (Signed)
Pt called wanting a refill on her HYDROcodone-acetaminophen (NORCO) 10-325 MG tablet [[id number]] Please advise: [phone]

## 2016-06-03 NOTE — Telephone Encounter (Signed)
Almyra Free please call her and document when her appointment with pain is.  She should have something coming up next month.

## 2016-06-06 ENCOUNTER — Ambulatory Visit (INDEPENDENT_AMBULATORY_CARE_PROVIDER_SITE_OTHER): Payer: Medicare Other | Admitting: Clinical

## 2016-06-06 ENCOUNTER — Encounter (HOSPITAL_COMMUNITY): Payer: Self-pay | Admitting: Clinical

## 2016-06-06 DIAGNOSIS — F4310 Post-traumatic stress disorder, unspecified: Secondary | ICD-10-CM | POA: Diagnosis not present

## 2016-06-06 DIAGNOSIS — F250 Schizoaffective disorder, bipolar type: Secondary | ICD-10-CM | POA: Diagnosis not present
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
## 2016-06-10 ENCOUNTER — Other Ambulatory Visit: Payer: Self-pay | Admitting: Physician Assistant

## 2016-06-10 DIAGNOSIS — G2581 Restless legs syndrome: Secondary | ICD-10-CM

## 2016-06-13 ENCOUNTER — Ambulatory Visit (HOSPITAL_COMMUNITY): Payer: Self-pay | Admitting: Psychiatry

## 2016-06-24 ENCOUNTER — Other Ambulatory Visit: Payer: Self-pay | Admitting: Physician Assistant

## 2016-06-24 DIAGNOSIS — G2581 Restless legs syndrome: Secondary | ICD-10-CM

## 2016-06-26 ENCOUNTER — Encounter: Payer: Self-pay | Admitting: Gynecology

## 2016-06-27 ENCOUNTER — Ambulatory Visit (HOSPITAL_COMMUNITY): Payer: Self-pay | Admitting: Clinical

## 2016-07-01 ENCOUNTER — Other Ambulatory Visit: Payer: Self-pay | Admitting: Family Medicine

## 2016-07-01 ENCOUNTER — Other Ambulatory Visit: Payer: Self-pay | Admitting: Physician Assistant

## 2016-07-02 ENCOUNTER — Telehealth: Payer: Self-pay | Admitting: Physician Assistant

## 2016-07-02 NOTE — Telephone Encounter (Signed)
Patient needs her HYDROcodone-acetaminophen ([place]) 10-325 MG tablet refilled by [name].  Her call back number is [phone]

## 2016-07-03 ENCOUNTER — Ambulatory Visit (HOSPITAL_COMMUNITY): Payer: Self-pay | Admitting: Psychiatry

## 2016-07-03 NOTE — Telephone Encounter (Signed)
I will attend to this first thing tomorrow when I get back to work. [name], MS, PA-C 12:50 PM, 07/03/2016

## 2016-07-03 NOTE — Telephone Encounter (Signed)
06/03/16 last refill

## 2016-07-04 ENCOUNTER — Other Ambulatory Visit: Payer: Self-pay | Admitting: Physician Assistant

## 2016-07-04 DIAGNOSIS — G8929 Other chronic pain: Secondary | ICD-10-CM

## 2016-07-04 DIAGNOSIS — M545 Low back pain: Principal | ICD-10-CM

## 2016-07-04 MED ORDER — HYDROCODONE-ACETAMINOPHEN 10-325 MG PO TABS: 1.0000 | ORAL_TABLET | Freq: Four times a day (QID) | ORAL | 0 refills | Status: DC | PRN

## 2016-07-11 ENCOUNTER — Ambulatory Visit (HOSPITAL_COMMUNITY): Payer: Self-pay | Admitting: Clinical

## 2016-07-15 ENCOUNTER — Ambulatory Visit: Payer: Medicare Other | Admitting: Physician Assistant

## 2016-07-18 ENCOUNTER — Ambulatory Visit: Payer: Medicare Other | Admitting: Physician Assistant

## 2016-07-20 NOTE — Telephone Encounter (Signed)
Patient states she couldn't get in with the Dr. Recommended to her.  She has a follow up appointment on 07/22/2016 will discuss this.

## 2016-07-23 ENCOUNTER — Other Ambulatory Visit: Payer: Self-pay | Admitting: Family Medicine

## 2016-07-24 ENCOUNTER — Ambulatory Visit: Payer: Medicare Other | Admitting: Physician Assistant

## 2016-07-25 ENCOUNTER — Other Ambulatory Visit (HOSPITAL_COMMUNITY): Payer: Self-pay | Admitting: Psychiatry

## 2016-07-29 ENCOUNTER — Ambulatory Visit (HOSPITAL_COMMUNITY): Payer: Self-pay | Admitting: Clinical

## 2016-07-31 ENCOUNTER — Encounter: Payer: Self-pay | Admitting: Physician Assistant

## 2016-07-31 ENCOUNTER — Ambulatory Visit (INDEPENDENT_AMBULATORY_CARE_PROVIDER_SITE_OTHER): Payer: Medicare Other | Admitting: Physician Assistant

## 2016-07-31 VITALS — BP 109/76 | HR 100 | Temp 97.8°F | Resp 18 | Ht 67.5 in | Wt 143.0 lb

## 2016-07-31 DIAGNOSIS — G8929 Other chronic pain: Secondary | ICD-10-CM | POA: Diagnosis not present

## 2016-07-31 DIAGNOSIS — F5104 Psychophysiologic insomnia: Secondary | ICD-10-CM | POA: Diagnosis not present

## 2016-07-31 DIAGNOSIS — J449 Chronic obstructive pulmonary disease, unspecified: Secondary | ICD-10-CM

## 2016-07-31 DIAGNOSIS — F331 Major depressive disorder, recurrent, moderate: Secondary | ICD-10-CM

## 2016-07-31 DIAGNOSIS — M545 Low back pain: Secondary | ICD-10-CM | POA: Diagnosis not present

## 2016-07-31 MED ORDER — PREDNISONE 50 MG PO TABS: ORAL_TABLET | ORAL | 0 refills | Status: DC

## 2016-07-31 MED ORDER — FLUTICASONE-SALMETEROL 230-21 MCG/ACT IN AERO: 2.0000 | INHALATION_SPRAY | Freq: Two times a day (BID) | RESPIRATORY_TRACT | 12 refills | Status: DC

## 2016-07-31 MED ORDER — HYDROCODONE-ACETAMINOPHEN 10-325 MG PO TABS: 1.0000 | ORAL_TABLET | Freq: Four times a day (QID) | ORAL | 0 refills | Status: DC | PRN

## 2016-07-31 MED ORDER — TIOTROPIUM BROMIDE MONOHYDRATE 18 MCG IN CAPS: 18.0000 ug | ORAL_CAPSULE | Freq: Every day | RESPIRATORY_TRACT | 6 refills | Status: DC

## 2016-07-31 MED ORDER — ROPINIROLE HCL 2 MG PO TABS: 2.0000 mg | ORAL_TABLET | Freq: Every day | ORAL | 3 refills | Status: DC

## 2016-07-31 NOTE — Patient Instructions (Signed)
     IF you received an x-ray today, you will receive an invoice from [facility]. Please contact [facility] at [phone] with questions or concerns regarding your invoice.   IF you received labwork today, you will receive an invoice from [facility]. Please contact [facility] at [phone] with questions or concerns regarding your invoice.   Our billing staff will not be able to assist you with questions regarding bills from these companies.  You will be contacted with the lab results as soon as they are available. The fastest way to get your results is to activate your My Chart account. Instructions are located on the last page of this paperwork. If you have not heard from us regarding the results in 2 weeks, please contact this office.     
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
## 2016-08-01 ENCOUNTER — Ambulatory Visit: Payer: Medicare Other | Admitting: Physician Assistant

## 2016-08-02 ENCOUNTER — Telehealth: Payer: Self-pay

## 2016-08-02 NOTE — Telephone Encounter (Signed)
Call placed to patient regarding need for appointment to referred provider. Also gave patient authorization to increase Seroquel from 200mg  to 300mg  per provider notes. Patient verbalized understanding and agreed to make appointment./ S.Early Ord,CMA

## 2016-08-02 NOTE — Progress Notes (Signed)
Please give her a call.  Her psychiatrist is okay with 300 mg of seroquel nightly.  Please have her call and make a follow up appointment and tell her that I really want her to go so Dr. Dossie Der can help her. [name], MS, PA-C 1:55 PM, 08/02/2016

## 2016-08-02 NOTE — Progress Notes (Signed)
She will make an appointment.  Thank you for your time. [name], MS, PA-C 1:53 PM, 08/02/2016

## 2016-08-02 NOTE — Telephone Encounter (Signed)
-----   Message from Tereasa Coop, PA-C sent at 08/02/2016  1:56 PM EDT -----   ----- Message ----- From: Kathlee Nations, MD Sent: 08/02/2016  12:12 PM To: Tereasa Coop, PA-C  She does not have any appointment.  If you want to manage her psychiatric medication please do so.  Yes she need a higher dose of Seroquel. ----- Message ----- From: Tereasa Coop, PA-C Sent: 08/01/2016   1:50 PM To: Kathlee Nations, MD  Dr. Dossie Der,  I saw Ms. Giovannetti in clinic yesterday and she did miss her last follow up with you. She stopped her Depakote and says this made her "Satan's Child..." She is taking the duloxetine and has stopped the sertraline per your reqs.  She continues to have poor sleep.  Would you be ammenable to a higher QHS dose of seroquel or possibly mirtazipine to add in sleep while she is waiting to get back in?  The other option is to just have her come back in which would probably be in her best interest.  I do have a neuro referral out to further investigate why she can not sleep. Please let me know what you prefer.

## 2016-08-20 ENCOUNTER — Other Ambulatory Visit: Payer: Self-pay | Admitting: Physician Assistant

## 2016-08-23 ENCOUNTER — Other Ambulatory Visit: Payer: Self-pay | Admitting: Physician Assistant

## 2016-08-26 ENCOUNTER — Ambulatory Visit (INDEPENDENT_AMBULATORY_CARE_PROVIDER_SITE_OTHER): Payer: Medicare Other | Admitting: Physician Assistant

## 2016-08-26 ENCOUNTER — Telehealth: Payer: Self-pay

## 2016-08-26 ENCOUNTER — Encounter: Payer: Self-pay | Admitting: Physician Assistant

## 2016-08-26 VITALS — BP 142/88 | HR 101 | Temp 98.3°F | Resp 16 | Ht 67.5 in | Wt 146.2 lb

## 2016-08-26 DIAGNOSIS — Z131 Encounter for screening for diabetes mellitus: Secondary | ICD-10-CM | POA: Diagnosis not present

## 2016-08-26 DIAGNOSIS — Z0000 Encounter for general adult medical examination without abnormal findings: Secondary | ICD-10-CM

## 2016-08-26 NOTE — Telephone Encounter (Signed)
Patient requesting refill for hydrocodone./ S.Tanayah Squitieri,CMA

## 2016-08-26 NOTE — Progress Notes (Signed)
Presents today for Medicare Visit.   Interpreter used for this visit? no   Cancer Screening: Cervical (every2 years - ages 21-65): Patient declines.    Other screening: ETOH use: No alcohol. Dental visits: No dentist. Wears dentures. Vision visits:No needed. Typical Meals: Eats whatever she wants at all times. She does like to eat vegetables. Eats no fast food in a week. She drinks roughly 2 soft drinks daily and these are pepsi.  Typical Beverages: Exercise:  Lab Screening: Last screening for diabetes (annually): None.  Patient amenbale Last lipid screening (every 5 years):  Lab Results  Component Value Date   CHOL 158 12/20/2015   HDL 85 12/20/2015   LDLCALC 51 12/20/2015   TRIG 112 12/20/2015   CHOLHDL 1.9 12/20/2015      ADVANCE DIRECTIVES: Discussed: yes On File: no Materials Provided: yes  Depression screen [facility] 2/9 08/26/2016 07/31/2016 05/15/2016 04/08/2016 03/04/2016  Decreased Interest 3 3 0 0 3  Down, Depressed, Hopeless 3 3 3  0 3  PHQ - 2 Score 6 6 3  0 6  Altered sleeping 3 3 - - 3  Tired, decreased energy 3 3 - - 1  Change in appetite 1 2 - - 1  Feeling bad or failure about yourself  3 3 - - 1  Trouble concentrating 3 3 - - 1  Moving slowly or fidgety/restless 1 0 - - 0  Suicidal thoughts 0 0 - - 0  PHQ-9 Score 20 20 - - 13  Difficult doing work/chores Very difficult Very difficult - - Very difficult    Fall Risk  08/26/2016 07/31/2016 05/15/2016 04/08/2016 03/04/2016  Falls in the past year? No No No No No  Number falls in past yr: - - - - -  Injury with Fall? - - - - -    Functional Status Survey: Is the patient deaf or have difficulty hearing?: No Does the patient have difficulty seeing, even when wearing glasses/contacts?: Yes Does the patient have difficulty concentrating, remembering, or making decisions?: Yes Does the patient have difficulty walking or climbing stairs?: Yes Does the patient have difficulty dressing or bathing?: No Does  the patient have difficulty doing errands alone such as visiting a doctor's office or shopping?: Yes   Immunization status:  Immunization History  Administered Date(s) Administered  . Influenza,inj,Quad PF,36+ Mos 03/04/2016  . Pneumococcal Polysaccharide-23 04/08/2016    There are no preventive care reminders to display for this patient.  Patient Care Team: Hillis Range as PCP - General (Urgent Care)   Patient Active Problem List   Diagnosis Date Noted  . Chronic pain syndrome 12/26/2015  . High risk medication use 12/26/2015  . Mood disorder ([place]) 12/26/2015  . Essential hypertension 12/26/2015  . Moderate episode of recurrent major depressive disorder ([place]) 12/26/2015  . Cysts of both ovaries 12/26/2015  . Migraine 12/26/2015     Past Medical History:  Diagnosis Date  . Allergy   . Anxiety   . Asthma   . COPD (chronic obstructive pulmonary disease) ([place])   . Depression   . Emphysema of lung ([place])   . High blood pressure   . Neuromuscular disorder [facility])      Past Surgical History:  Procedure Laterality Date  . HERNIA REPAIR     3  . lapoma removal  Right   . Lumbar laminectony and fusion     2     Family History  Problem Relation Age of Onset  . Diabetes  Mother   . Heart disease Mother   . Mental illness Mother   . Hyperlipidemia Father   . Stroke Father   . Diabetes Sister   . Heart disease Sister   . Hyperlipidemia Sister   . Stroke Sister     Social History   Social History  . Marital status: Single    Spouse name: N/A  . Number of children: N/A  . Years of education: N/A   Occupational History  . Not on file.   Social History Main Topics  . Smoking status: Current Some Day Smoker  . Smokeless tobacco: Never Used  . Alcohol use No  . Drug use: No  . Sexual activity: Yes    Birth control/ protection: None   Other Topics Concern  . Not on file   Social History Narrative  . No narrative on file     Allergies    Allergen Reactions  . Mobic [Meloxicam] Swelling    Swelling of tongue   . Ultram [Tramadol Hcl] Swelling    Swelling of tongue    Prior to Admission medications   Medication Sig Start Date End Date Taking? Authorizing Provider  DULoxetine (CYMBALTA) 20 MG capsule Take 1 capsule for 2 week and than twice daily 05/22/16   Arfeen, Arlyce Harman, MD  fluticasone-salmeterol (ADVAIR HFA) 230-21 MCG/ACT inhaler Inhale 2 puffs into the lungs 2 (two) times daily. 07/31/16   Tereasa Coop, PA-C  gabapentin (NEURONTIN) 600 MG tablet TAKE ONE TABLET BY MOUTH 3 TIMES A DAY 08/26/16   Tereasa Coop, PA-C  HYDROcodone-acetaminophen (NORCO) 10-325 MG tablet Take 1 tablet by mouth every 6 (six) hours as needed for severe pain. 07/31/16   Tereasa Coop, PA-C  lisinopril (PRINIVIL,ZESTRIL) 10 MG tablet Take 1 tablet (10 mg total) by mouth daily. 12/20/15   Scot Jun, FNP  metoprolol (LOPRESSOR) 50 MG tablet Take 1 tablet (50 mg total) by mouth 2 (two) times daily. 12/20/15   Scot Jun, FNP  predniSONE (DELTASONE) 50 MG tablet Take 1 tab daily in the morning. 07/31/16   Tereasa Coop, PA-C  PROAIR HFA 108 (90 Base) MCG/ACT inhaler INHALE 2 PUFFS BY MOUTH EVERY 4 HOURS AS NEEDED FOR WHEEZING OR SHORTNESS OF BREATH 05/24/16   Tereasa Coop, PA-C  promethazine (PHENERGAN) 12.5 MG tablet Take 1 tablet (12.5 mg total) by mouth every 8 (eight) hours as needed for nausea or vomiting. 04/08/16   Tereasa Coop, PA-C  QUEtiapine (SEROQUEL) 200 MG tablet Take 1 tablet (200 mg total) by mouth at bedtime. 12/20/15   Scot Jun, FNP  rOPINIRole (REQUIP) 2 MG tablet Take 1-2 tablets (2-4 mg total) by mouth at bedtime. 07/31/16   Tereasa Coop, PA-C  tiotropium (SPIRIVA) 18 MCG inhalation capsule Place 1 capsule (18 mcg total) into inhaler and inhale daily. 07/31/16   Tereasa Coop, PA-C  tiZANidine (ZANAFLEX) 4 MG tablet TAKE ONE TABLET BY MOUTH EVERY 6 HOURS AS NEEDED FOR MUSCLE SPAMS 07/02/16    Tereasa Coop, PA-C  topiramate (TOPAMAX) 25 MG tablet Take 1 tablet (25 mg total) by mouth 2 (two) times daily. 12/20/15   Scot Jun, FNP      ELECTROCARDIOGRAM (once at welcome to medicare) 04/08/2016 NSR and within normal limits.     PHYSICAL EXAM: BP (!) 150/93 (BP Location: Right Arm, Patient Position: Sitting, Cuff Size: Large)   Pulse (!) 101   Temp 98.3 F (36.8 C) (Oral)  Resp 16   Ht 5' 7.5" (1.715 m)   Wt 146 lb 3.2 oz (66.3 kg)   LMP 08/26/2016   SpO2 95%   BMI 22.56 kg/m   BP Readings from Last 3 Encounters:  08/26/16 (!) 150/93  07/31/16 109/76  05/15/16 (!) 144/92   Wt Readings from Last 3 Encounters:  08/26/16 146 lb 3.2 oz (66.3 kg)  07/31/16 143 lb (64.9 kg)  05/15/16 143 lb 12.8 oz (65.2 kg)     Visual Acuity Screening   Right eye Left eye Both eyes  Without correction:     With correction: 20/20 20/25 20/20     Physical Exam  Constitutional: She is oriented to person, place, and time. She appears well-developed and well-nourished. No distress.  Eyes: Pupils are equal, round, and reactive to light. Conjunctivae and EOM are normal.  Neck: No thyromegaly present.  Cardiovascular: Normal rate.   Respiratory: Effort normal.  GI: She exhibits no distension.  Musculoskeletal: Normal range of motion.  Neurological: She is alert and oriented to person, place, and time. She has normal reflexes.  Skin: Skin is warm and dry. She is not diaphoretic.     Education/Counseling: yes diet and exercise yes prevention of chronic diseases yes smoking/tobacco cessation yes review "Covered Medicare Preventive Services"  Jeremy was seen today for annual exam.  Diagnoses and all orders for this visit:  Encounter for initial preventive physical examination covered by Medicare  Screening for diabetes mellitus -     Hemoglobin A1C

## 2016-08-26 NOTE — Patient Instructions (Signed)
     IF you received an x-ray today, you will receive an invoice from [facility]. Please contact [facility] at [phone] with questions or concerns regarding your invoice.   IF you received labwork today, you will receive an invoice from [facility]. Please contact [facility] at [phone] with questions or concerns regarding your invoice.   Our billing staff will not be able to assist you with questions regarding bills from these companies.  You will be contacted with the lab results as soon as they are available. The fastest way to get your results is to activate your My Chart account. Instructions are located on the last page of this paperwork. If you have not heard from us regarding the results in 2 weeks, please contact this office.     

## 2016-08-27 LAB — HEMOGLOBIN A1C
Est. average glucose Bld gHb Est-mCnc: 117 mg/dL
Hgb A1c MFr Bld: 5.7 % — ABNORMAL HIGH (ref 4.8–5.6)

## 2016-08-27 NOTE — Telephone Encounter (Signed)
Patient wants a refill on hydrocodone.

## 2016-08-28 ENCOUNTER — Other Ambulatory Visit: Payer: Self-pay | Admitting: Physician Assistant

## 2016-08-28 ENCOUNTER — Telehealth: Payer: Self-pay | Admitting: *Deleted

## 2016-08-28 DIAGNOSIS — M545 Low back pain, unspecified: Secondary | ICD-10-CM

## 2016-08-28 DIAGNOSIS — G8929 Other chronic pain: Secondary | ICD-10-CM

## 2016-08-28 MED ORDER — HYDROCODONE-ACETAMINOPHEN 10-325 MG PO TABS: 1.0000 | ORAL_TABLET | Freq: Four times a day (QID) | ORAL | 0 refills | Status: DC | PRN

## 2016-08-28 NOTE — Progress Notes (Unsigned)
[name] please send [facility] my last three encounters with Rita Hunter along with her most recent psych note as well as the MRI of her back.  We have got to get her in with [facility].  I have been writing her pain medicine now for about 3 months.  Please call [facility] in a few days and speak with referrals to make sure they have everything that this need. [name], MS, PA-C 11:32 AM, 08/28/2016

## 2016-08-28 NOTE — Telephone Encounter (Signed)
LMOM for Rx pickup at 102.

## 2016-08-28 NOTE — Telephone Encounter (Signed)
I'll stop by the office in about two hours to print and sign. Please let her know the medication will be ready for pick up at 4 pm.

## 2016-09-11 DIAGNOSIS — G894 Chronic pain syndrome: Secondary | ICD-10-CM | POA: Diagnosis not present

## 2016-09-11 DIAGNOSIS — M961 Postlaminectomy syndrome, not elsewhere classified: Secondary | ICD-10-CM | POA: Diagnosis not present

## 2016-09-11 DIAGNOSIS — M545 Low back pain: Secondary | ICD-10-CM | POA: Diagnosis not present

## 2016-09-11 DIAGNOSIS — Z79891 Long term (current) use of opiate analgesic: Secondary | ICD-10-CM | POA: Diagnosis not present

## 2016-09-11 DIAGNOSIS — Z79899 Other long term (current) drug therapy: Secondary | ICD-10-CM | POA: Diagnosis not present

## 2016-09-11 DIAGNOSIS — M79606 Pain in leg, unspecified: Secondary | ICD-10-CM | POA: Diagnosis not present

## 2016-09-17 ENCOUNTER — Ambulatory Visit (INDEPENDENT_AMBULATORY_CARE_PROVIDER_SITE_OTHER): Payer: Medicare Other | Admitting: Neurology

## 2016-09-17 ENCOUNTER — Encounter (INDEPENDENT_AMBULATORY_CARE_PROVIDER_SITE_OTHER): Payer: Self-pay

## 2016-09-17 ENCOUNTER — Encounter: Payer: Self-pay | Admitting: Neurology

## 2016-09-17 VITALS — BP 118/70 | HR 88 | Ht 66.0 in | Wt 143.0 lb

## 2016-09-17 DIAGNOSIS — F17200 Nicotine dependence, unspecified, uncomplicated: Secondary | ICD-10-CM | POA: Diagnosis not present

## 2016-09-17 DIAGNOSIS — F5105 Insomnia due to other mental disorder: Secondary | ICD-10-CM | POA: Diagnosis not present

## 2016-09-17 DIAGNOSIS — J449 Chronic obstructive pulmonary disease, unspecified: Secondary | ICD-10-CM | POA: Diagnosis not present

## 2016-09-17 DIAGNOSIS — F3112 Bipolar disorder, current episode manic without psychotic features, moderate: Secondary | ICD-10-CM | POA: Diagnosis not present

## 2016-09-17 NOTE — Progress Notes (Signed)
SLEEP MEDICINE CLINIC   Provider:  Larey Hunter, M D  Primary Care Physician:  Rita Hunter   Referring Provider: Tereasa Coop, Rita-C   Chief Complaint  Patient presents with  . New Patient (Initial Visit)    pt sister in room, new patient coming in for chronic insomnia, sleep study done several years back dont remember when.     HPI:  Rita Hunter is a 46 y.o. female , seen here as in a referral/ revisit  from Dr. Carlis Hunter for chronic insomnia, cyclic insomnia.   Rita Hunter is a 46 year old right-handed biracial  female patient who moved from [place] to the [place] area about 8 months ago. She has been treated by primary care at [facility]. She was managed by psychiatry, carries a diagnosis of paranoid schizophrenia, and was first treated in 2012 for this condition. She saw Dr. Dossie Hunter , who started her on Depakote, duloxetine and she was asked to wean off Zoloft in the generic form of sertraline. She was given belch summer but this was not helping her sleep. She did not like the Depakote effect. She was placed also on Cymbalta which has helped some and helps also with pain issues. Pain management has been is set up. The patient is here today presenting with a pH Q9 score of 20, still suffering from depression. She reports a inability to go to sleep at night and difficulties to stay awake in daytime.   She reports that she sometimes can be up without sleep for 3 days in a row, affecting her ability to remember, organize, keep appointments. She is also withdrawing from Diazepam, which has not been refilled since she left [place].  The patient had been on diazepam for years, when she could no longer get a refill she felt that her insomnia exacerbated, she was hypervigilant, jumpy at times more anxious and jittery.  Sleep habits are as follows: Over the last couple of days she may have only slept 5 hours, these periods of insomnia can be followed by periods of hypersomnia.-she is here  with her sister, goes to bed by midnight, cannot sleep. She has left the bed often frustrated from inability to initiate sleep. She may go on watch TV or look at her smart phone, she does describe her bedroom is cool, quiet and dark. She prefers to sleep on her side, due to back pain. She sleeps with 5 pillows. Some to support her head some to help her body position. Rises at 1 PM but often stays in bed.    Sleep medical history and family sleep history:  Medical history described above, mental health history as documented by Rita Hunter. She is BIPOLAR, racing thoughts. She has also medical diagnosis of COPD, lower back surgery, abdominal hernia with mesh implant which was removed, lipoma of the shoulder removed in 2017 did undergo a tonsillectomy in childhood.  she has a family history of diabetes in her mother and sister , heart disease in her mother and sister, hyperlipidemia in her father and sister, mental illness, HTN  in her mother, who had a mental break down and was  abused by her husband. Maternal grandmother had mental illness, alcoholic.  There is stroke in her father and sister.  The patient uses tobacco she smokes, she does not drink alcohol she does not use illegal drugs, caffeine - drinks coffee 1-2 cups in AM. Soda 2 a day, iced tea- none.  Has been a Copywriter, advertising, remote - 6  years ago, for many years.   Review of Systems: Out of a complete 14 system review, the patient complains of only the following symptoms, and all other reviewed systems are negative.   [name] induced death interest in activities, racing thoughts at night, decreased energy and daytime, the feeling of not getting enough sleep or no sleep at all, anxiety, depression, headaches, numbness, or jitteriness, restlessness, joint pain.    Social History   Social History  . Marital status: Single    Spouse name: N/A  . Number of children: N/A  . Years of education: N/A   Occupational History  . Not on file.     Social History Main Topics  . Smoking status: Current Some Day Smoker  . Smokeless tobacco: Never Used  . Alcohol use No  . Drug use: No  . Sexual activity: Yes    Birth control/ protection: None   Other Topics Concern  . Not on file   Social History Narrative  . No narrative on file    Family History  Problem Relation Age of Onset  . Diabetes Mother   . Heart disease Mother   . Mental illness Mother   . Hyperlipidemia Father   . Stroke Father   . Diabetes Sister   . Heart disease Sister   . Hyperlipidemia Sister   . Stroke Sister     Past Medical History:  Diagnosis Date  . Allergy   . Anxiety   . Asthma   . COPD (chronic obstructive pulmonary disease) ([place])   . Depression   . Emphysema of lung ([place])   . High blood pressure   . Neuromuscular disorder [facility])     Past Surgical History:  Procedure Laterality Date  . HERNIA REPAIR     3  . lapoma removal  Right   . Lumbar laminectony and fusion     2    Current Outpatient Prescriptions  Medication Sig Dispense Refill  . DULoxetine (CYMBALTA) 20 MG capsule Take 1 capsule for 2 week and than twice daily 60 capsule 1  . fluticasone-salmeterol (ADVAIR HFA) 230-21 MCG/ACT inhaler Inhale 2 puffs into the lungs 2 (two) times daily. 1 Inhaler 12  . gabapentin (NEURONTIN) 600 MG tablet TAKE ONE TABLET BY MOUTH 3 TIMES A DAY 90 tablet 0  . HYDROcodone-acetaminophen (NORCO) 10-325 MG tablet Take 1 tablet by mouth every 6 (six) hours as needed for severe pain. Do not mix with tizanidine at night. 120 tablet 0  . lisinopril (PRINIVIL,ZESTRIL) 10 MG tablet Take 1 tablet (10 mg total) by mouth daily. 90 tablet 3  . metoprolol (LOPRESSOR) 50 MG tablet Take 1 tablet (50 mg total) by mouth 2 (two) times daily. 90 tablet 3  . PROAIR HFA 108 (90 Base) MCG/ACT inhaler INHALE 2 PUFFS BY MOUTH EVERY 4 HOURS AS NEEDED FOR WHEEZING OR SHORTNESS OF BREATH 8.5 g 11  . promethazine (PHENERGAN) 12.5 MG tablet Take 1 tablet (12.5 mg  total) by mouth every 8 (eight) hours as needed for nausea or vomiting. 20 tablet 1  . QUEtiapine (SEROQUEL) 200 MG tablet Take 1 tablet (200 mg total) by mouth at bedtime. 90 tablet 3  . rOPINIRole (REQUIP) 2 MG tablet Take 1-2 tablets (2-4 mg total) by mouth at bedtime. 180 tablet 3  . tiotropium (SPIRIVA) 18 MCG inhalation capsule Place 1 capsule (18 mcg total) into inhaler and inhale daily. 30 capsule 6  . tiZANidine (ZANAFLEX) 4 MG tablet TAKE ONE TABLET BY MOUTH EVERY  6 HOURS AS NEEDED FOR MUSCLE SPAMS 120 tablet 10  . topiramate (TOPAMAX) 25 MG tablet Take 1 tablet (25 mg total) by mouth 2 (two) times daily. 90 tablet 3   No current facility-administered medications for this visit.     Allergies as of 09/17/2016 - Review Complete 09/17/2016  Allergen Reaction Noted  . Mobic [meloxicam] Swelling 11/27/2015  . Ultram [tramadol hcl] Swelling 11/27/2015    Vitals: BP 118/70   Pulse 88   Ht 5\' 6"  (1.676 m)   Wt 143 lb (64.9 kg)   LMP 08/26/2016   BMI 23.08 kg/m  Last Weight:  Wt Readings from Last 1 Encounters:  09/17/16 143 lb (64.9 kg)   JQB:HALP mass index is 23.08 kg/m.     Last Height:   Ht Readings from Last 1 Encounters:  09/17/16 5\' 6"  (1.676 m)    Physical exam:  General: The patient is awake, restless.  The patient is poorly  groomed. Head: Normocephalic, atraumatic. Neck is supple. Mallampati 1.  neck circumference: 14. Nasal airflow patent ,  Cardiovascular:  Regular rate and rhythm, without  murmurs or carotid bruit, and without distended neck veins. Respiratory: Lungs ; wheezing  Skin:  Without evidence of edema, or rash- tattooes.  Trunk: BMI is 23   Neurologic exam : The patient is awake and alert, oriented to place and time.   Memory subjective  described as intact.  Attention span & concentration ability appears normal.  Speech is fluent,  without  dysarthria, dysphonia or aphasia.  Mood and affect are appropriate.  Cranial nerves: Pupils are  equal and briskly reactive to light. Funduscopic exam without  evidence of pallor or edema. Extraocular movements  in vertical and horizontal planes intact and without nystagmus. Visual fields by finger perimetry are intact. Hearing to finger rub intact.  Facial sensation intact to fine touch. Facial motor strength is symmetric and tongue and uvula move midline. Shoulder shrug was symmetrical.   Motor exam: Normal tone, muscle bulk and symmetric strength in all extremities.     Assessment:  After physical and neurologic examination, review of laboratory studies,  Personal review of imaging studies, reports of other /same  Imaging studies, results of polysomnography and / or neurophysiology testing and pre-existing records as far as provided in visit., my assessment is   1) dear Rita Hunter,  Rita Hunter cyclic sleep disorder has been present for most of her adult life. Insomnia has responded to adequate pain medication, diazepam as a muscle relaxant as well as an anxiolytic, and has not been found to be associated with organic sleep dysfunction. Rita Hunter has been diagnosed with bipolar disorder, and chronic and cyclic insomnia is a hallmark of that disease. Unfortunately she had not found the treatment suggestions by her psychiatrist to be effective. The diazepam was not refilled and she felt that she has a rather painful with straw, and rebound insomnia. She stays on seroquel , now 600 mg daily, and Cymbalta- now eestablished with pain managment .  She has at various times discontinued medication without the knowledge of the prescribing physician.   2) I will be happy to evalute her for apnea, but will not treat psychiatric / psychological , and pain disorders. She has COPD and use of Diazepam would be dangerous.    The patient was advised of the nature of the diagnosed disorder , the treatment options and the  risks for general health and wellness arising from not treating the condition.   I  spent more than 45 minutes of face to face time with the patient.  Greater than 50% of time was spent in counseling and coordination of care. We have discussed the diagnosis and differential and I answered the patient's questions.    Plan:  Treatment plan and additional workup :   Dear Rita Fendt, Rita.  I ordered a HST to rule out/ rule in organic sleep disorder. This patient has chronic insomnia , likely unrelated to organic factors and correlated to her psychiatric history. If her HST is negative, I will not follow further. The patient was only seen In the sleep clinic, not General neurological clinic.    Rita Seat, MD 0/0/3704, 88:89 AM  Certified in Neurology by ABPN Certified in [place] by [facility] [address], [place], [place] [address]

## 2016-09-18 ENCOUNTER — Other Ambulatory Visit: Payer: Self-pay | Admitting: Physician Assistant

## 2016-09-19 ENCOUNTER — Telehealth: Payer: Self-pay

## 2016-09-19 NOTE — Telephone Encounter (Signed)
Received a request in Dr. Raul Del box to fax over last 5 progress notes for patient to the pain clinic, I think this is where. These were faxed and a confirmation fax was received.

## 2016-09-19 NOTE — Telephone Encounter (Signed)
Req refills on 1) Gabapentin:  Per 7/10 note, pt needs appt at end of August for any refills. Sent to [facility]. Sent to scheduling to make appt.  Denied rx.  2) Refill on Requip:  07/31/2016 given #180  With 3 refills - Pt takes 1-2 HS. - called to [facility]. Pt last picked up refill on 07/31/2016.  Pt now requesting [facility].   Denied - pt getting refills at [facility]

## 2016-09-20 ENCOUNTER — Other Ambulatory Visit: Payer: Self-pay | Admitting: Physician Assistant

## 2016-09-23 ENCOUNTER — Other Ambulatory Visit: Payer: Self-pay | Admitting: Physician Assistant

## 2016-10-01 ENCOUNTER — Telehealth: Payer: Self-pay | Admitting: Physician Assistant

## 2016-10-01 ENCOUNTER — Other Ambulatory Visit: Payer: Self-pay

## 2016-10-01 DIAGNOSIS — M545 Low back pain, unspecified: Secondary | ICD-10-CM

## 2016-10-01 DIAGNOSIS — G8929 Other chronic pain: Secondary | ICD-10-CM

## 2016-10-01 NOTE — Telephone Encounter (Signed)
Patient has an apt with pain management on the 29th. Okay to refill until her appointment ?

## 2016-10-01 NOTE — Telephone Encounter (Signed)
PATIENT WOULD LIKE MICHAEL TO KNOW THAT SHE WENT TO SEE PAMELA CAMPBELL AT PAIN MANAGEMENT. SHE HAS LEFT MESSAGES THAT SHE NEEDS A REFILL ON HER HYDROCODONE-ACETAMINOPHEN 10-325 MG, BUT SHE WILL NOT CALL HER BACK. HER NEXT APPOINTMENT TO SEE HER IS NOT UNTIL 10/09/16. SHE STATES SHE HAS BEEN OUT OF HER MEDICINE SINCE Friday. SHE WOULD LIKE MICHAEL TO REFILL IT FOR HER UNTIL THEN. BEST PHONE [phone] (CELL) [place]

## 2016-10-01 NOTE — Telephone Encounter (Signed)
Request sent to provider. Awaiting response.

## 2016-10-02 MED ORDER — HYDROCODONE-ACETAMINOPHEN 10-325 MG PO TABS: 1.0000 | ORAL_TABLET | Freq: Four times a day (QID) | ORAL | 0 refills | Status: DC | PRN

## 2016-10-02 NOTE — Telephone Encounter (Signed)
Yes.  I will do this today once I get to work.  Thank you Brionna. [name], MS, PA-C 8:47 AM, 10/02/2016

## 2016-10-09 DIAGNOSIS — M545 Low back pain: Secondary | ICD-10-CM | POA: Diagnosis not present

## 2016-10-09 DIAGNOSIS — M79606 Pain in leg, unspecified: Secondary | ICD-10-CM | POA: Diagnosis not present

## 2016-10-09 DIAGNOSIS — G894 Chronic pain syndrome: Secondary | ICD-10-CM | POA: Diagnosis not present

## 2016-10-09 DIAGNOSIS — Z79899 Other long term (current) drug therapy: Secondary | ICD-10-CM | POA: Diagnosis not present

## 2016-10-09 DIAGNOSIS — Z79891 Long term (current) use of opiate analgesic: Secondary | ICD-10-CM | POA: Diagnosis not present

## 2016-10-09 DIAGNOSIS — M961 Postlaminectomy syndrome, not elsewhere classified: Secondary | ICD-10-CM | POA: Diagnosis not present

## 2016-10-10 ENCOUNTER — Telehealth: Payer: Self-pay | Admitting: Neurology

## 2016-10-10 NOTE — Telephone Encounter (Signed)
We have attempted to call the patient 3 times to schedule sleep study. Patient has been unavailable at the phone numbers we have on file and has not returned our calls. At this point we will send a letter asking pt to please contact the sleep lab to schedule their sleep study. If patient calls back we will schedule them for their sleep study. ° °

## 2016-10-21 ENCOUNTER — Ambulatory Visit: Payer: Medicare Other | Admitting: Physician Assistant

## 2016-10-21 ENCOUNTER — Other Ambulatory Visit: Payer: Self-pay | Admitting: Physician Assistant

## 2016-10-22 ENCOUNTER — Ambulatory Visit: Payer: Medicare Other | Admitting: Physician Assistant

## 2016-10-29 ENCOUNTER — Telehealth: Payer: Self-pay | Admitting: Physician Assistant

## 2016-10-29 NOTE — Telephone Encounter (Signed)
[place] is calling to see why the medication ropinirole was denied   Best number [phone]

## 2016-10-29 NOTE — Telephone Encounter (Signed)
Spoke with pharmacy, patient received 180 tabs with 3 refills on 07/31/16. Pharmacy states that they will contact patient

## 2016-10-30 DIAGNOSIS — G894 Chronic pain syndrome: Secondary | ICD-10-CM | POA: Diagnosis not present

## 2016-10-30 DIAGNOSIS — Z79891 Long term (current) use of opiate analgesic: Secondary | ICD-10-CM | POA: Diagnosis not present

## 2016-10-30 DIAGNOSIS — M545 Low back pain: Secondary | ICD-10-CM | POA: Diagnosis not present

## 2016-10-30 DIAGNOSIS — Z79899 Other long term (current) drug therapy: Secondary | ICD-10-CM | POA: Diagnosis not present

## 2016-10-30 DIAGNOSIS — M961 Postlaminectomy syndrome, not elsewhere classified: Secondary | ICD-10-CM | POA: Diagnosis not present

## 2016-10-30 DIAGNOSIS — M5417 Radiculopathy, lumbosacral region: Secondary | ICD-10-CM | POA: Diagnosis not present

## 2016-10-30 DIAGNOSIS — M79606 Pain in leg, unspecified: Secondary | ICD-10-CM | POA: Diagnosis not present

## 2016-10-31 ENCOUNTER — Ambulatory Visit: Payer: Medicare Other | Admitting: Physician Assistant

## 2016-11-19 ENCOUNTER — Other Ambulatory Visit: Payer: Self-pay | Admitting: Family Medicine

## 2016-11-28 ENCOUNTER — Ambulatory Visit (INDEPENDENT_AMBULATORY_CARE_PROVIDER_SITE_OTHER): Payer: Medicare Other | Admitting: Physician Assistant

## 2016-11-28 ENCOUNTER — Encounter: Payer: Self-pay | Admitting: Physician Assistant

## 2016-11-28 VITALS — BP 132/84 | HR 105 | Temp 98.7°F | Resp 16 | Ht 66.0 in | Wt 140.0 lb

## 2016-11-28 DIAGNOSIS — J22 Unspecified acute lower respiratory infection: Secondary | ICD-10-CM | POA: Diagnosis not present

## 2016-11-28 DIAGNOSIS — J441 Chronic obstructive pulmonary disease with (acute) exacerbation: Secondary | ICD-10-CM

## 2016-11-28 MED ORDER — BENZONATATE 200 MG PO CAPS: 200.0000 mg | ORAL_CAPSULE | Freq: Two times a day (BID) | ORAL | 0 refills | Status: DC | PRN

## 2016-11-28 MED ORDER — ALBUTEROL SULFATE (2.5 MG/3ML) 0.083% IN NEBU
2.5000 mg | INHALATION_SOLUTION | Freq: Once | RESPIRATORY_TRACT | Status: AC
  Administered 2016-11-28: 2.5 mg via RESPIRATORY_TRACT

## 2016-11-28 MED ORDER — DOXYCYCLINE HYCLATE 100 MG PO CAPS: 100.0000 mg | ORAL_CAPSULE | Freq: Two times a day (BID) | ORAL | 0 refills | Status: AC

## 2016-11-28 MED ORDER — TIOTROPIUM BROMIDE MONOHYDRATE 18 MCG IN CAPS: 18.0000 ug | ORAL_CAPSULE | Freq: Every day | RESPIRATORY_TRACT | 6 refills | Status: DC

## 2016-11-28 MED ORDER — IPRATROPIUM BROMIDE 0.02 % IN SOLN
0.5000 mg | Freq: Once | RESPIRATORY_TRACT | Status: AC
  Administered 2016-11-28: 0.5 mg via RESPIRATORY_TRACT

## 2016-11-28 NOTE — Progress Notes (Signed)
11/28/2016 6:39 PM   DOB: 1971-01-27 / MRN: [id number]  SUBJECTIVE:  Rita Hunter is a 46 y.o. female presenting for cough.  This started about 5 days ago. She has a history of COPD and continues to smoke. Feels that she is getting somewhat better at this time. Is compliant with COPD meds. Has tried OTC cough meds with little success.  Has needed doxy and prednisone in the past.    Brother is still out of the house.  She is allergic to mobic [meloxicam] and ultram [tramadol hcl].   She  has a past medical history of Allergy; Anxiety; Asthma; COPD (chronic obstructive pulmonary disease) ([place]); Depression; Emphysema of lung ([place]); High blood pressure; and Neuromuscular disorder ([place]).    She  reports that she has been smoking.  She has never used smokeless tobacco. She reports that she does not drink alcohol or use drugs. She  reports that she currently engages in sexual activity. She reports using the following method of birth control/protection: None. The patient  has a past surgical history that includes Hernia repair; Lumbar laminectony and fusion; and lapoma removal  (Right).  Her family history includes Diabetes in her mother and sister; Heart disease in her mother and sister; Hyperlipidemia in her father and sister; Mental illness in her mother; Stroke in her father and sister.  Review of Systems  Constitutional: Negative for chills, diaphoresis and fever.  Eyes: Negative.   Respiratory: Positive for cough. Negative for hemoptysis, sputum production, shortness of breath and wheezing.   Cardiovascular: Negative for chest pain, orthopnea and leg swelling.  Gastrointestinal: Negative for nausea.  Skin: Negative for rash.  Neurological: Negative for dizziness, sensory change, speech change, focal weakness and headaches.    The problem list and medications were reviewed and updated by myself where necessary and exist elsewhere in the encounter.   OBJECTIVE:  BP 132/84 (BP Location:  Right Arm, Patient Position: Sitting, Cuff Size: Normal)   Pulse (!) 105   Temp 98.7 F (37.1 C) (Oral)   Resp 16   Ht 5\' 6"  (1.676 m)   Wt 140 lb (63.5 kg)   SpO2 99%   BMI 22.60 kg/m   Pulse Readings from Last 3 Encounters:  11/28/16 (!) 105  09/17/16 88  08/26/16 (!) 101     Physical Exam  Constitutional: She is oriented to person, place, and time. She is active.  Eyes: Pupils are equal, round, and reactive to light. EOM are normal.  Cardiovascular: Normal rate, regular rhythm, S1 normal, S2 normal, normal heart sounds and intact distal pulses.  Exam reveals no gallop, no friction rub and no decreased pulses.   No murmur heard. Pulmonary/Chest: Effort normal. No stridor. No tachypnea. No respiratory distress. She has wheezes. She has no rales. She exhibits no tenderness.  Abdominal: She exhibits no distension.  Musculoskeletal: She exhibits no edema.  Neurological: She is alert and oriented to person, place, and time. She has normal strength and normal reflexes. She is not disoriented. She displays no atrophy. No cranial nerve deficit or sensory deficit. She exhibits normal muscle tone. Coordination and gait normal.  Skin: Skin is warm.  Psychiatric: Her behavior is normal.    No results found for this or any previous visit (from the past 72 hour(s)).  No results found.  ASSESSMENT AND PLAN:  Rita Hunter was seen today for cough.  Diagnoses and all orders for this visit:  LRTI (lower respiratory tract infection) -     doxycycline (VIBRAMYCIN)  100 MG capsule; Take 1 capsule (100 mg total) by mouth 2 (two) times daily. -     benzonatate (TESSALON) 200 MG capsule; Take 1 capsule (200 mg total) by mouth 2 (two) times daily as needed for cough. -     albuterol (PROVENTIL) (2.5 MG/3ML) 0.083% nebulizer solution 2.5 mg; Take 3 mLs (2.5 mg total) by nebulization once. -     ipratropium (ATROVENT) nebulizer solution 0.5 mg; Take 2.5 mLs (0.5 mg total) by nebulization once.  COPD  exacerbation (HCC) -     tiotropium (SPIRIVA) 18 MCG inhalation capsule; Place 1 capsule (18 mcg total) into inhaler and inhale daily.    The patient is advised to call or return to clinic if she does not see an improvement in symptoms, or to seek the care of the closest emergency department if she worsens with the above plan.   [name], MHS, PA-C [facility] 11/28/2016 6:39 PM

## 2016-11-28 NOTE — Patient Instructions (Signed)
     IF you received an x-ray today, you will receive an invoice from [facility]. Please contact [facility] at [phone] with questions or concerns regarding your invoice.   IF you received labwork today, you will receive an invoice from [facility]. Please contact [facility] at [phone] with questions or concerns regarding your invoice.   Our billing staff will not be able to assist you with questions regarding bills from these companies.  You will be contacted with the lab results as soon as they are available. The fastest way to get your results is to activate your My Chart account. Instructions are located on the last page of this paperwork. If you have not heard from us regarding the results in 2 weeks, please contact this office.     

## 2016-12-09 DIAGNOSIS — G894 Chronic pain syndrome: Secondary | ICD-10-CM | POA: Diagnosis not present

## 2016-12-09 DIAGNOSIS — M5417 Radiculopathy, lumbosacral region: Secondary | ICD-10-CM | POA: Diagnosis not present

## 2016-12-09 DIAGNOSIS — M961 Postlaminectomy syndrome, not elsewhere classified: Secondary | ICD-10-CM | POA: Diagnosis not present

## 2016-12-09 DIAGNOSIS — Z79891 Long term (current) use of opiate analgesic: Secondary | ICD-10-CM | POA: Diagnosis not present

## 2016-12-09 DIAGNOSIS — M545 Low back pain: Secondary | ICD-10-CM | POA: Diagnosis not present

## 2016-12-09 DIAGNOSIS — Z79899 Other long term (current) drug therapy: Secondary | ICD-10-CM | POA: Diagnosis not present

## 2016-12-17 DIAGNOSIS — M79609 Pain in unspecified limb: Secondary | ICD-10-CM | POA: Diagnosis not present

## 2016-12-17 DIAGNOSIS — R200 Anesthesia of skin: Secondary | ICD-10-CM | POA: Diagnosis not present

## 2016-12-17 DIAGNOSIS — M545 Low back pain: Secondary | ICD-10-CM | POA: Diagnosis not present

## 2016-12-24 DIAGNOSIS — M545 Low back pain: Secondary | ICD-10-CM | POA: Diagnosis not present

## 2016-12-24 DIAGNOSIS — M79606 Pain in leg, unspecified: Secondary | ICD-10-CM | POA: Diagnosis not present

## 2016-12-24 DIAGNOSIS — M961 Postlaminectomy syndrome, not elsewhere classified: Secondary | ICD-10-CM | POA: Diagnosis not present

## 2016-12-24 DIAGNOSIS — Z79891 Long term (current) use of opiate analgesic: Secondary | ICD-10-CM | POA: Diagnosis not present

## 2016-12-24 DIAGNOSIS — G894 Chronic pain syndrome: Secondary | ICD-10-CM | POA: Diagnosis not present

## 2016-12-24 DIAGNOSIS — Z79899 Other long term (current) drug therapy: Secondary | ICD-10-CM | POA: Diagnosis not present

## 2017-01-06 DIAGNOSIS — G894 Chronic pain syndrome: Secondary | ICD-10-CM | POA: Diagnosis not present

## 2017-01-06 DIAGNOSIS — M79606 Pain in leg, unspecified: Secondary | ICD-10-CM | POA: Diagnosis not present

## 2017-01-06 DIAGNOSIS — Z79899 Other long term (current) drug therapy: Secondary | ICD-10-CM | POA: Diagnosis not present

## 2017-01-06 DIAGNOSIS — M545 Low back pain: Secondary | ICD-10-CM | POA: Diagnosis not present

## 2017-01-06 DIAGNOSIS — Z79891 Long term (current) use of opiate analgesic: Secondary | ICD-10-CM | POA: Diagnosis not present

## 2017-01-06 DIAGNOSIS — M961 Postlaminectomy syndrome, not elsewhere classified: Secondary | ICD-10-CM | POA: Diagnosis not present

## 2017-01-27 ENCOUNTER — Other Ambulatory Visit: Payer: Self-pay

## 2017-01-27 ENCOUNTER — Other Ambulatory Visit: Payer: Self-pay | Admitting: Physician Assistant

## 2017-01-27 MED ORDER — FLUTICASONE-SALMETEROL 230-21 MCG/ACT IN AERO: 2.0000 | INHALATION_SPRAY | Freq: Two times a day (BID) | RESPIRATORY_TRACT | 0 refills | Status: DC

## 2017-01-27 NOTE — Progress Notes (Signed)
Approved for refill of Advair, appointment scheduled for 03/03/17.

## 2017-01-30 DIAGNOSIS — Z79891 Long term (current) use of opiate analgesic: Secondary | ICD-10-CM | POA: Diagnosis not present

## 2017-01-30 DIAGNOSIS — M961 Postlaminectomy syndrome, not elsewhere classified: Secondary | ICD-10-CM | POA: Diagnosis not present

## 2017-01-30 DIAGNOSIS — M545 Low back pain: Secondary | ICD-10-CM | POA: Diagnosis not present

## 2017-01-30 DIAGNOSIS — Z79899 Other long term (current) drug therapy: Secondary | ICD-10-CM | POA: Diagnosis not present

## 2017-01-30 DIAGNOSIS — G894 Chronic pain syndrome: Secondary | ICD-10-CM | POA: Diagnosis not present

## 2017-01-30 DIAGNOSIS — M79606 Pain in leg, unspecified: Secondary | ICD-10-CM | POA: Diagnosis not present

## 2017-02-27 DIAGNOSIS — M545 Low back pain: Secondary | ICD-10-CM | POA: Diagnosis not present

## 2017-02-27 DIAGNOSIS — M961 Postlaminectomy syndrome, not elsewhere classified: Secondary | ICD-10-CM | POA: Diagnosis not present

## 2017-02-27 DIAGNOSIS — Z79899 Other long term (current) drug therapy: Secondary | ICD-10-CM | POA: Diagnosis not present

## 2017-02-27 DIAGNOSIS — G894 Chronic pain syndrome: Secondary | ICD-10-CM | POA: Diagnosis not present

## 2017-02-27 DIAGNOSIS — Z79891 Long term (current) use of opiate analgesic: Secondary | ICD-10-CM | POA: Diagnosis not present

## 2017-02-27 DIAGNOSIS — M79606 Pain in leg, unspecified: Secondary | ICD-10-CM | POA: Diagnosis not present

## 2017-03-03 ENCOUNTER — Ambulatory Visit: Payer: Medicare Other | Admitting: Physician Assistant

## 2017-03-27 DIAGNOSIS — M79606 Pain in leg, unspecified: Secondary | ICD-10-CM | POA: Diagnosis not present

## 2017-03-27 DIAGNOSIS — Z79891 Long term (current) use of opiate analgesic: Secondary | ICD-10-CM | POA: Diagnosis not present

## 2017-03-27 DIAGNOSIS — Z79899 Other long term (current) drug therapy: Secondary | ICD-10-CM | POA: Diagnosis not present

## 2017-03-27 DIAGNOSIS — M961 Postlaminectomy syndrome, not elsewhere classified: Secondary | ICD-10-CM | POA: Diagnosis not present

## 2017-03-27 DIAGNOSIS — G894 Chronic pain syndrome: Secondary | ICD-10-CM | POA: Diagnosis not present

## 2017-03-31 ENCOUNTER — Ambulatory Visit: Payer: Medicare Other | Admitting: Physician Assistant

## 2017-04-24 ENCOUNTER — Other Ambulatory Visit: Payer: Self-pay | Admitting: Physician Assistant

## 2017-04-29 DIAGNOSIS — M961 Postlaminectomy syndrome, not elsewhere classified: Secondary | ICD-10-CM | POA: Diagnosis not present

## 2017-04-29 DIAGNOSIS — G894 Chronic pain syndrome: Secondary | ICD-10-CM | POA: Diagnosis not present

## 2017-04-29 DIAGNOSIS — Z79899 Other long term (current) drug therapy: Secondary | ICD-10-CM | POA: Diagnosis not present

## 2017-04-29 DIAGNOSIS — Z79891 Long term (current) use of opiate analgesic: Secondary | ICD-10-CM | POA: Diagnosis not present

## 2017-04-29 DIAGNOSIS — M545 Low back pain: Secondary | ICD-10-CM | POA: Diagnosis not present

## 2017-04-29 DIAGNOSIS — M79606 Pain in leg, unspecified: Secondary | ICD-10-CM | POA: Diagnosis not present

## 2017-05-21 ENCOUNTER — Other Ambulatory Visit: Payer: Self-pay | Admitting: Physician Assistant

## 2017-05-21 NOTE — Telephone Encounter (Signed)
tizanadine (Zanaflex) Last refill: 07/02/16 #120 tabs 11 RF LOV: "Historical provider"- cannot find note that addresses this med Pharmacy: [place] ,[place] PCP: [name] PA

## 2017-05-22 DIAGNOSIS — Z79899 Other long term (current) drug therapy: Secondary | ICD-10-CM | POA: Diagnosis not present

## 2017-05-22 DIAGNOSIS — Z79891 Long term (current) use of opiate analgesic: Secondary | ICD-10-CM | POA: Diagnosis not present

## 2017-05-22 DIAGNOSIS — M545 Low back pain: Secondary | ICD-10-CM | POA: Diagnosis not present

## 2017-05-22 DIAGNOSIS — G894 Chronic pain syndrome: Secondary | ICD-10-CM | POA: Diagnosis not present

## 2017-06-19 DIAGNOSIS — Z79891 Long term (current) use of opiate analgesic: Secondary | ICD-10-CM | POA: Diagnosis not present

## 2017-06-19 DIAGNOSIS — M545 Low back pain: Secondary | ICD-10-CM | POA: Diagnosis not present

## 2017-06-19 DIAGNOSIS — Z79899 Other long term (current) drug therapy: Secondary | ICD-10-CM | POA: Diagnosis not present

## 2017-06-19 DIAGNOSIS — M79606 Pain in leg, unspecified: Secondary | ICD-10-CM | POA: Diagnosis not present

## 2017-06-19 DIAGNOSIS — M961 Postlaminectomy syndrome, not elsewhere classified: Secondary | ICD-10-CM | POA: Diagnosis not present

## 2017-06-19 DIAGNOSIS — G894 Chronic pain syndrome: Secondary | ICD-10-CM | POA: Diagnosis not present

## 2017-07-21 ENCOUNTER — Other Ambulatory Visit: Payer: Self-pay | Admitting: Physician Assistant

## 2017-07-28 ENCOUNTER — Other Ambulatory Visit: Payer: Self-pay

## 2017-07-28 ENCOUNTER — Ambulatory Visit (INDEPENDENT_AMBULATORY_CARE_PROVIDER_SITE_OTHER): Payer: Medicare Other | Admitting: Physician Assistant

## 2017-07-28 ENCOUNTER — Encounter: Payer: Self-pay | Admitting: Physician Assistant

## 2017-07-28 ENCOUNTER — Ambulatory Visit (INDEPENDENT_AMBULATORY_CARE_PROVIDER_SITE_OTHER): Payer: Medicare Other

## 2017-07-28 VITALS — BP 118/74 | HR 87 | Temp 98.6°F | Ht 66.0 in | Wt 127.0 lb

## 2017-07-28 DIAGNOSIS — G894 Chronic pain syndrome: Secondary | ICD-10-CM | POA: Diagnosis not present

## 2017-07-28 DIAGNOSIS — J449 Chronic obstructive pulmonary disease, unspecified: Secondary | ICD-10-CM

## 2017-07-28 DIAGNOSIS — R05 Cough: Secondary | ICD-10-CM

## 2017-07-28 DIAGNOSIS — R059 Cough, unspecified: Secondary | ICD-10-CM

## 2017-07-28 MED ORDER — PREDNISONE 20 MG PO TABS: ORAL_TABLET | ORAL | 0 refills | Status: AC

## 2017-07-28 MED ORDER — DOXYCYCLINE HYCLATE 100 MG PO CAPS: 100.0000 mg | ORAL_CAPSULE | Freq: Two times a day (BID) | ORAL | 0 refills | Status: AC

## 2017-07-28 NOTE — Patient Instructions (Signed)
     IF you received an x-ray today, you will receive an invoice from [facility]. Please contact  Radiology at [phone] with questions or concerns regarding your invoice.   IF you received labwork today, you will receive an invoice from [facility]. Please contact [facility] at [phone] with questions or concerns regarding your invoice.   Our billing staff will not be able to assist you with questions regarding bills from these companies.  You will be contacted with the lab results as soon as they are available. The fastest way to get your results is to activate your My Chart account. Instructions are located on the last page of this paperwork. If you have not heard from us regarding the results in 2 weeks, please contact this office.     

## 2017-07-28 NOTE — Progress Notes (Signed)
07/29/2017 8:46 AM   DOB: December 21, 1970 / MRN: [id number]  SUBJECTIVE:  Rita Hunter is a 47 y.o. female presenting for cough.  This started about 2 weeks ago.  Patient has a history of COPD.  She continues to smoke.  She is losing weight and tells me that she is not really eating well.  She complains of chronic pain secondary to lumbar surgery. She was recently dismissed from [facility] for a urine screen being positive for cocaine.  This is documented under the media tab in [facility].  She wants me to prescribed her narcotics today.  She is tearful when she learns that I will not. I did offer to increase her duloxetine and gabapentin, or try and NSAID and she was not interested in these.   She is allergic to mobic [meloxicam] and ultram [tramadol hcl].   She  has a past medical history of Allergy, Anxiety, Asthma, COPD (chronic obstructive pulmonary disease) ([place]), Depression, Emphysema of lung ([place]), High blood pressure, and Neuromuscular disorder ([place]).    She  reports that she has been smoking.  She has never used smokeless tobacco. She reports that she does not drink alcohol or use drugs. She  reports that she currently engages in sexual activity. She reports using the following method of birth control/protection: None. The patient  has a past surgical history that includes Hernia repair; Lumbar laminectony and fusion; and lapoma removal  (Right).  Her family history includes Diabetes in her mother and sister; Heart disease in her mother and sister; Hyperlipidemia in her father and sister; Mental illness in her mother; Stroke in her father and sister.  Review of Systems  Constitutional: Negative for fever.  Respiratory: Positive for cough and sputum production. Negative for hemoptysis, shortness of breath and wheezing.   Cardiovascular: Negative for chest pain and leg swelling.  Musculoskeletal: Positive for back pain, joint pain, myalgias and neck pain. Negative for falls.    Neurological: Negative for dizziness.    The problem list and medications were reviewed and updated by myself where necessary and exist elsewhere in the encounter.   OBJECTIVE:  BP 118/74 (BP Location: Left Arm, Patient Position: Sitting, Cuff Size: Normal)   Pulse 87   Temp 98.6 F (37 C) (Oral)   Ht 5\' 6"  (1.676 m)   Wt 127 lb (57.6 kg)   LMP 06/30/2017   SpO2 98%   BMI 20.50 kg/m   Wt Readings from Last 3 Encounters:  07/28/17 127 lb (57.6 kg)  11/28/16 140 lb (63.5 kg)  09/17/16 143 lb (64.9 kg)   Temp Readings from Last 3 Encounters:  07/28/17 98.6 F (37 C) (Oral)  11/28/16 98.7 F (37.1 C) (Oral)  08/26/16 98.3 F (36.8 C) (Oral)    BP Readings from Last 3 Encounters:  07/28/17 118/74  11/28/16 132/84  09/17/16 118/70   Pulse Readings from Last 3 Encounters:  07/28/17 87  11/28/16 (!) 105  09/17/16 88     Physical Exam  Constitutional: She is oriented to person, place, and time. She appears well-developed and well-nourished. No distress.  Eyes: Pupils are equal, round, and reactive to light. EOM are normal.  Cardiovascular: Normal rate, regular rhythm, S1 normal, S2 normal, normal heart sounds and intact distal pulses. Exam reveals no gallop, no friction rub and no decreased pulses.  No murmur heard. Pulmonary/Chest: Effort normal. No stridor. No respiratory distress. She has no wheezes. She has no rales.  Abdominal: She exhibits no distension.  Musculoskeletal:  Normal range of motion. She exhibits no edema.  Neurological: She is alert and oriented to person, place, and time. No cranial nerve deficit. Gait normal.  Skin: Skin is warm and dry. She is not diaphoretic.  Psychiatric: She has a normal mood and affect.  Vitals reviewed.   No results found for this or any previous visit (from the past 72 hour(s)).  Dg Chest 2 View  Result Date: 07/28/2017 CLINICAL DATA:  Cough. EXAM: CHEST - 2 VIEW COMPARISON:  Radiographs of November 27, 2015. FINDINGS:  The heart size and mediastinal contours are within normal limits. No pneumothorax or pleural effusion is noted. Stable small calcified granuloma seen in right lower lobe. No acute pulmonary abnormality is noted. The visualized skeletal structures are unremarkable. IMPRESSION: No active cardiopulmonary disease. Electronically Signed   By: Marijo Conception, M.D.   On: 07/28/2017 16:14    ASSESSMENT AND PLAN:  [name] was seen today for cough and neck pain.  Diagnoses and all orders for this visit:  Cough -     DG Chest 2 View; Future -     predniSONE (DELTASONE) 20 MG tablet; Take 3 in the morning for 3 days, then 2 in the morning for 3 days, and then 1 in the morning for 3 days. -     doxycycline (VIBRAMYCIN) 100 MG capsule; Take 1 capsule (100 mg total) by mouth 2 (two) times daily for 10 days.  Chronic obstructive pulmonary disease, unspecified COPD type ([place]): Exacerbation. See problem one.   Chronic pain syndrome: Narcotics refused 2/2 [facility]  [facility] at [facility].  Will refer.     The patient is advised to call or return to clinic if she does not see an improvement in symptoms, or to seek the care of the closest emergency department if she worsens with the above plan.   [name], MHS, PA-C [facility] 07/29/2017 8:46 AM

## 2017-08-20 ENCOUNTER — Other Ambulatory Visit: Payer: Self-pay | Admitting: Physician Assistant

## 2017-08-21 NOTE — Telephone Encounter (Signed)
Prescription for ropinirole 2 mg tab expired on 07/31/17  M. [place], Malcolm  Cassell Clement  07/28/17  [id number]

## 2017-08-25 ENCOUNTER — Other Ambulatory Visit: Payer: Self-pay | Admitting: Physician Assistant

## 2017-08-25 NOTE — Telephone Encounter (Signed)
Gabapentin refill Last OV:08/26/16 Last refill:09/21/16 90 tab/11 refill [facility] Pharmacy: [place], [place] [phone] (Phone) [phone] (Fax)

## 2017-08-26 NOTE — Telephone Encounter (Signed)
rx given 1 year ago.  ? Needs return visit. Message for refill gabapentin sent to [facility]

## 2017-08-29 ENCOUNTER — Other Ambulatory Visit: Payer: Self-pay | Admitting: Physician Assistant

## 2017-09-01 NOTE — Telephone Encounter (Signed)
Metoprolol 50 mg and Topamax 25 mg refill requests  LOV 07/28/17 with [name] for an acute visit.  Last physical over a year ago.  [place], [place]

## 2017-09-04 NOTE — Telephone Encounter (Signed)
Refill request for metoprolol 50 mg #60 and topiramate 25 mg #6 with 0 refills.  Pt needs ov.  Will send to schedulers to schedule ov med refills with Rita Hunter before he leaves. DGaddy, CMA

## 2017-09-18 ENCOUNTER — Telehealth: Payer: Self-pay | Admitting: Physician Assistant

## 2017-09-18 NOTE — Telephone Encounter (Signed)
Copied from [place] [id number]. Topic: Quick Communication - Rx Refill/Question >> Sep 18, 2017  3:45 PM Burchel, Abbi R wrote: Medication: HYDROcodone-acetaminophen [facility]) 10-325 MG tablet   Preferred Pharmacy:  [facility] [place] [place] [address] Phone: [phone] Fax: [phone]   Pt was advised that RX refills may take up to 3 business days.

## 2017-09-18 NOTE — Telephone Encounter (Signed)
Please advise 

## 2017-09-18 NOTE — Telephone Encounter (Signed)
Spoke with pt and she would like to know if she could get something for pain until she can be seen by the pain clinic. I did not see a referral for pain management so she would like to be referred. Please advise

## 2017-09-18 NOTE — Telephone Encounter (Signed)
Copied from [place] [phone]. Topic: Referral - Request >> Sep 18, 2017  3:23 PM Lennox Solders wrote: Reason for CRM: pt is waiting on new referral to pain management for her back. Pt [facility] in past and does not want to go back

## 2017-09-19 ENCOUNTER — Other Ambulatory Visit: Payer: Self-pay | Admitting: Physician Assistant

## 2017-09-19 DIAGNOSIS — G894 Chronic pain syndrome: Secondary | ICD-10-CM

## 2017-09-19 NOTE — Telephone Encounter (Signed)
Please see my last note.  No pain meds from me. [name], MS, PA-C 6:12 PM, 09/19/2017

## 2017-09-25 NOTE — Telephone Encounter (Signed)
Sending message to Referrals

## 2017-09-25 NOTE — Telephone Encounter (Signed)
Pt was sent to [facility] on 8/10

## 2017-10-01 ENCOUNTER — Telehealth: Payer: Self-pay | Admitting: Physician Assistant

## 2017-10-01 NOTE — Telephone Encounter (Signed)
PATIENT WAS LEFT MULTIPLE MESSAGES FROM Rita Hunter ON 8/13 AND 8/19 TO NO AVAIL AND THEY HAVE NOT RECVD A CALL BACK

## 2017-10-17 ENCOUNTER — Other Ambulatory Visit: Payer: Self-pay | Admitting: Physician Assistant

## 2017-10-17 NOTE — Telephone Encounter (Signed)
Metoprolol; Topiramate refill Last Refill? Last OV:07/28/17 with [name] PCP: Pt has not established with new provider [place]

## 2017-10-23 ENCOUNTER — Other Ambulatory Visit: Payer: Self-pay | Admitting: Physician Assistant

## 2017-10-23 NOTE — Telephone Encounter (Signed)
Seroquel 200 mg;  Lisinopril 10 mg; Zoloft 100 mg refill Last Refill:11/19/16 # 30  With 11 refills.   All 3 medications with same refill date and quantity. Last OV: 07/25/17 with [name] PCP: [place], [place]  [address]  Returned because  Former pt of [name] not established with another provider.

## 2017-10-30 ENCOUNTER — Telehealth: Payer: Self-pay | Admitting: Family Medicine

## 2017-10-30 NOTE — Telephone Encounter (Signed)
Copied from [place] [phone]. Topic: General - Other >> Oct 30, 2017  9:37 AM Bea Graff, NT wrote: Reason for CRM: Pt states that she needs a note that states she has COPD to turn in to her electric company for her bill. Please advise.

## 2017-11-03 ENCOUNTER — Telehealth: Payer: Self-pay | Admitting: Physician Assistant

## 2017-11-03 NOTE — Telephone Encounter (Signed)
Patient called back to inquire about Letter for electric company. She stated that her light will be turned off if she does not get this. Patient request a call back today. Please advise. Ph# [phone]

## 2017-11-03 NOTE — Telephone Encounter (Signed)
pls see note. Thank you

## 2017-11-03 NOTE — Telephone Encounter (Signed)
Called pt and set up appt to establish care with [facility]. Advised of time, building and late policy

## 2017-11-03 NOTE — Telephone Encounter (Signed)
Letter provided by Caro Hight, RN

## 2017-11-21 ENCOUNTER — Ambulatory Visit: Payer: Medicare Other | Admitting: Physician Assistant

## 2017-11-21 NOTE — Progress Notes (Deleted)
   Rita Hunter  MRN: [id number] DOB: 1970/04/01  Subjective:  Rita Hunter is a 47 y.o. female seen in office today for a chief complaint of ***  Review of Systems  Patient Active Problem List   Diagnosis Date Noted  . Chronic pain syndrome 12/26/2015  . High risk medication use 12/26/2015  . Mood disorder ([place]) 12/26/2015  . Essential hypertension 12/26/2015  . Moderate episode of recurrent major depressive disorder ([place]) 12/26/2015  . Cysts of both ovaries 12/26/2015  . Migraine 12/26/2015    Current Outpatient Medications on File Prior to Visit  Medication Sig Dispense Refill  . ADVAIR HFA 115-21 MCG/ACT inhaler INHALE 2 PUFF BY MOUTH TWICE DAILY 12 g 11  . benzonatate (TESSALON) 200 MG capsule Take 1 capsule (200 mg total) by mouth 2 (two) times daily as needed for cough. 20 capsule 0  . DULoxetine (CYMBALTA) 20 MG capsule Take 1 capsule for 2 week and than twice daily 60 capsule 1  . fluticasone-salmeterol (ADVAIR HFA) 230-21 MCG/ACT inhaler Inhale 2 puffs into the lungs 2 (two) times daily. 1 Inhaler 0  . gabapentin (NEURONTIN) 600 MG tablet TAKE ONE TABLET BY MOUTH 3 TIMES A DAY 90 tablet 11  . lisinopril (PRINIVIL,ZESTRIL) 10 MG tablet TAKE ONE TABLET BY MOUTH EVERY DAY 30 tablet 0  . metoprolol tartrate (LOPRESSOR) 50 MG tablet TAKE ONE TABLET BY MOUTH TWICE DAILY 60 tablet 0  . PROAIR HFA 108 (90 Base) MCG/ACT inhaler Inhale 2 puffs by mouth every 4 hours as needed for wheezing or shortness of breath 1 each 11  . QUEtiapine (SEROQUEL) 200 MG tablet TAKE ONE TABLET BY MOUTH AT BEDTIME 30 tablet 0  . rOPINIRole (REQUIP) 2 MG tablet TAKE 1 TO 2 TABLETS(2 TO 4 MG) BY MOUTH AT BEDTIME 60 tablet 0  . sertraline (ZOLOFT) 100 MG tablet TAKE ONE TABLET BY MOUTH EVERY DAY 30 tablet 0  . tiotropium (SPIRIVA) 18 MCG inhalation capsule Place 1 capsule (18 mcg total) into inhaler and inhale daily. 30 capsule 6  . tiZANidine (ZANAFLEX) 4 MG tablet TAKE ONE TABLET BY MOUTH EVERY 6 HOURS AS  NEEDED FOR MUSCLE SPASMS 120 tablet 0  . topiramate (TOPAMAX) 25 MG tablet TAKE ONE TABLET BY MOUTH TWICE DAILY 60 tablet 0   No current facility-administered medications on file prior to visit.     Allergies  Allergen Reactions  . Mobic [Meloxicam] Swelling    Swelling of tongue   . Ultram [Tramadol Hcl] Swelling    Swelling of tongue     Objective:  There were no vitals taken for this visit.  Physical Exam  Assessment and Plan :  *** There are no diagnoses linked to this encounter.   Tenna Delaine PA-C  [facility] 11/21/2017 7:24 AM

## 2017-11-28 ENCOUNTER — Other Ambulatory Visit: Payer: Self-pay | Admitting: Physician Assistant

## 2017-12-02 ENCOUNTER — Ambulatory Visit (INDEPENDENT_AMBULATORY_CARE_PROVIDER_SITE_OTHER): Payer: Medicare Other | Admitting: Physician Assistant

## 2017-12-02 ENCOUNTER — Other Ambulatory Visit: Payer: Self-pay | Admitting: Physician Assistant

## 2017-12-02 ENCOUNTER — Encounter: Payer: Self-pay | Admitting: Physician Assistant

## 2017-12-02 ENCOUNTER — Other Ambulatory Visit: Payer: Self-pay

## 2017-12-02 VITALS — BP 159/105 | HR 108 | Temp 98.5°F | Wt 120.8 lb

## 2017-12-02 DIAGNOSIS — F39 Unspecified mood [affective] disorder: Secondary | ICD-10-CM | POA: Diagnosis not present

## 2017-12-02 DIAGNOSIS — G8929 Other chronic pain: Secondary | ICD-10-CM | POA: Diagnosis not present

## 2017-12-02 DIAGNOSIS — M545 Low back pain: Secondary | ICD-10-CM | POA: Diagnosis not present

## 2017-12-02 DIAGNOSIS — I10 Essential (primary) hypertension: Secondary | ICD-10-CM | POA: Diagnosis not present

## 2017-12-02 DIAGNOSIS — G2581 Restless legs syndrome: Secondary | ICD-10-CM | POA: Diagnosis not present

## 2017-12-02 MED ORDER — ROPINIROLE HCL 2 MG PO TABS: ORAL_TABLET | ORAL | 0 refills | Status: DC

## 2017-12-02 MED ORDER — TIZANIDINE HCL 4 MG PO TABS: ORAL_TABLET | ORAL | 1 refills | Status: DC

## 2017-12-02 NOTE — Progress Notes (Signed)
Rita Hunter  MRN: [id number] DOB: [birth year]  Subjective:  Rita Hunter is a 47 y.o. female seen in office today for a chief complaint of medication refill for low back pain. Does not have a PCP. Has been moving back and forth from [place] and [place] receiving care.  Moved here recently to help out with family.  Has PMH of COPD, neuropathy, HTN, schizophrenia, depression, RLS, migraine,and chronic pain.  COPD: Takes Advair twice daily, Spiriva daily, and albuterol as needed.  Neuropathy: Takes gabapentin 600 mg 3 times a day.  HTN: Takes lisinopril 10 mg and metoprolol 50 mg daily.  Not checking sugar outside the office.  Schizophrenia: Takes quetiapine 200 mg daily.  Depression: Takes Zoloft 100 mg daily.  RLS: Takes Requip 1 to 2 tablets by mouth at bedtime.  Needs refill.  Migraine: Takes Topamax 25 mg twice daily.  Chronic back pain: takes Zanaflex 4 mg twice daily for spasms and hydrocodone-acetaminophen 10/325 mg daily.  Notes she has been on hydrocodone for at least 8 years.  She has been out of medication for 8 days.  Last Prescription in [place].  Does not have prescription bottle, name of pharmacy, or paperwork with her.  She has been followed by pain management both in [place] and [place].  Reports she did not follow up with pain management here because she did not like the provider at that office..  Denies illicit drug use.  Notes her pain back pain at this time is a 12 out of 10.  Used to take duloxetine but quit.  Does not want to be back on it but cannot say why.  Denies saddle anesthesia, bladder/bowel incontinence, numbness, tingling, weakness of bilateral lower extremities. 2 back surgeries: last one in 2015.  Reports that PA Clark was refilling her pain meds here whenever she was not pain management.    Review of Systems  Constitutional: Negative for chills, diaphoresis and fever.  Respiratory: Negative for shortness of breath.   Cardiovascular: Negative for  chest pain, palpitations and leg swelling.  Gastrointestinal: Negative for nausea and vomiting.  Genitourinary: Negative for hematuria.  Neurological: Negative for dizziness, light-headedness and numbness.    Patient Active Problem List   Diagnosis Date Noted  . Chronic pain syndrome 12/26/2015  . High risk medication use 12/26/2015  . Mood disorder ([place]) 12/26/2015  . Essential hypertension 12/26/2015  . Moderate episode of recurrent major depressive disorder ([place]) 12/26/2015  . Cysts of both ovaries 12/26/2015  . Migraine 12/26/2015    Current Outpatient Medications on File Prior to Visit  Medication Sig Dispense Refill  . ADVAIR HFA 115-21 MCG/ACT inhaler INHALE 2 PUFF BY MOUTH TWICE DAILY 12 g 11  . benzonatate (TESSALON) 200 MG capsule Take 1 capsule (200 mg total) by mouth 2 (two) times daily as needed for cough. 20 capsule 0  . fluticasone-salmeterol (ADVAIR HFA) 230-21 MCG/ACT inhaler Inhale 2 puffs into the lungs 2 (two) times daily. 1 Inhaler 0  . gabapentin (NEURONTIN) 600 MG tablet TAKE ONE TABLET BY MOUTH 3 TIMES A DAY 90 tablet 11  . lisinopril (PRINIVIL,ZESTRIL) 10 MG tablet TAKE ONE TABLET BY MOUTH EVERY DAY 30 tablet 0  . metoprolol tartrate (LOPRESSOR) 50 MG tablet TAKE ONE TABLET BY MOUTH TWICE DAILY 60 tablet 0  . PROAIR HFA 108 (90 Base) MCG/ACT inhaler Inhale 2 puffs by mouth every 4 hours as needed for wheezing or shortness of breath 1 each 11  . QUEtiapine (SEROQUEL) 200 MG tablet TAKE ONE  TABLET BY MOUTH AT BEDTIME 30 tablet 0  . sertraline (ZOLOFT) 100 MG tablet TAKE ONE TABLET BY MOUTH EVERY DAY 30 tablet 0  . tiotropium (SPIRIVA) 18 MCG inhalation capsule Place 1 capsule (18 mcg total) into inhaler and inhale daily. 30 capsule 6  . topiramate (TOPAMAX) 25 MG tablet TAKE ONE TABLET BY MOUTH TWICE DAILY 60 tablet 0   No current facility-administered medications on file prior to visit.     Allergies  Allergen Reactions  . Mobic [Meloxicam] Swelling     Swelling of tongue   . Ultram [Tramadol Hcl] Swelling    Swelling of tongue      Social History   Socioeconomic History  . Marital status: Single    Spouse name: Not on file  . Number of children: Not on file  . Years of education: Not on file  . Highest education level: Not on file  Occupational History  . Not on file  Social Needs  . Financial resource strain: Not on file  . Food insecurity:    Worry: Not on file    Inability: Not on file  . Transportation needs:    Medical: Not on file    Non-medical: Not on file  Tobacco Use  . Smoking status: Current Some Day Smoker  . Smokeless tobacco: Never Used  Substance and Sexual Activity  . Alcohol use: No  . Drug use: No  . Sexual activity: Yes    Birth control/protection: None  Lifestyle  . Physical activity:    Days per week: Not on file    Minutes per session: Not on file  . Stress: Not on file  Relationships  . Social connections:    Talks on phone: Not on file    Gets together: Not on file    Attends religious service: Not on file    Active member of club or organization: Not on file    Attends meetings of clubs or organizations: Not on file    Relationship status: Not on file  . Intimate partner violence:    Fear of current or ex partner: Not on file    Emotionally abused: Not on file    Physically abused: Not on file    Forced sexual activity: Not on file  Other Topics Concern  . Not on file  Social History Narrative  . Not on file    Objective:  BP (!) 159/105   Pulse (!) 108   Temp 98.5 F (36.9 C)   Wt 120 lb 12.8 oz (54.8 kg)   LMP 11/29/2017   SpO2 97%   BMI 19.50 kg/m   Physical Exam  Constitutional: She is oriented to person, place, and time. She appears well-developed and well-nourished. No distress.  Appears older than stated age.   HENT:  Head: Normocephalic and atraumatic.  Mouth/Throat: Uvula is midline, oropharynx is clear and moist and mucous membranes are normal.  Eyes:  Pupils are equal, round, and reactive to light. Conjunctivae and EOM are normal.  Neck: Normal range of motion.  Cardiovascular: Normal rate, regular rhythm, normal heart sounds and intact distal pulses.  Pulmonary/Chest: Effort normal and breath sounds normal. She has no wheezes. She has no rhonchi. She has no rales.  Musculoskeletal:       Thoracic back: Normal.       Lumbar back: She exhibits tenderness (mild TTP with palpation of b/l lumbar musculature). She exhibits normal range of motion, no bony tenderness and no spasm.  Right lower leg: She exhibits no swelling.       Left lower leg: She exhibits no swelling.  Neurological: She is alert and oriented to person, place, and time. Gait normal.  Reflex Scores:      Patellar reflexes are 2+ on the right side and 2+ on the left side.      Achilles reflexes are 2+ on the right side and 2+ on the left side. Strength of BLE 5/5 Sensation of BLE intact  Skin: Skin is warm and dry.  Psychiatric: Her affect is angry. She expresses no homicidal and no suicidal ideation.  Tearful   Vitals reviewed.   Assessment and Plan :   1. Chronic midline low back pain without sciatica Review of her last OV with PA Clark in our office on 07/28/2017 shows that he was not refilling her pain medication at that time as she had been dismissed from preferred pain management for a urine drug screen being positive for cocaine.  She was upset with [name] that he would not prescribe her narcotics at that time.  He recommended increasing duloxetine and gabapentin or trying NSAIDs and she refused.  He referred her to a different pain management clinic at that time.  Do not see it in the chart that she followed up with them.  This must be the time that she went to [place].  Per review of [place] controlled substance database registry, her last refill for hydrocodone was on 06/19/2017 for 60 tablets (i.e. 15 days).  Last reported prescription from [place]  was 12/18/2015.  When asked about the positive urine drug screen with pain management, patient got extremely frustrated and said that she would only test positive for cocaine when she saw a certain provider at the pain management office but would never test positive when she saw other providers.    I have refused narcotics today 2/2  inconsistent hx, unclear dx/reason for chronic narcotic use, falsehoods about where she has been receiving narcotics, and prior +cocaine UDS.  Will place referral for pain management for further evaluation and management of chronic pain.  Did discuss increasing Zanaflex and adding on additional agent such as duloxetine or NSAID for management of pain.  Patient refuses and is both tearful and angry.  Refills for Requip and Zanaflex provided.   - Ambulatory referral to Pain Clinic - tiZANidine (ZANAFLEX) 4 MG tablet; TAKE ONE TABLET BY MOUTH EVERY 6 HOURS AS NEEDED FOR MUSCLE SPASMS  Dispense: 120 tablet; Refill: 1  2. Mood disorder ([place]) Recommend she follow-up with psychiatry for further management of schizophrenia as she has not seen a psychiatrist in years.  She is stable at this time.  Denies SI or HI.  Follow-up as needed. - Ambulatory referral to Psychiatry  3. RLS (restless legs syndrome) Refills for requip provided.   4. Essential hypertension Uncontrolled. Asymptomatic. Instructed to check bp outside of office over the next couple of weeks. Return if consistently >140/90. Given strict ED precautions.   A total of 40 minutes was spent in the room with the patient, greater than 50% of which was in counseling/coordination of care regarding above.  Tenna Delaine PA-C  [facility] 12/07/2017 1:04 PM

## 2017-12-02 NOTE — Patient Instructions (Addendum)
Please follow up with pain management and psychiatry.      If you have lab work done today you will be contacted with your lab results within the next 2 weeks.  If you have not heard from [place] then please contact us. The fastest way to get your results is to register for My Chart.   IF you received an x-ray today, you will receive an invoice from [facility]. Please contact [facility] at [phone] with questions or concerns regarding your invoice.   IF you received labwork today, you will receive an invoice from [facility]. Please contact [facility] at [phone] with questions or concerns regarding your invoice.   Our billing staff will not be able to assist you with questions regarding bills from these companies.  You will be contacted with the lab results as soon as they are available. The fastest way to get your results is to activate your My Chart account. Instructions are located on the last page of this paperwork. If you have not heard from [place] regarding the results in 2 weeks, please contact this office.

## 2017-12-02 NOTE — Telephone Encounter (Signed)
Requested Prescriptions  Pending Prescriptions Disp Refills  . rOPINIRole (REQUIP) 2 MG tablet [Pharmacy Med Name: ROPINIROLE 2MG  TABLETS] 180 tablet 0    Sig: TAKE 1 TO 2 TABLETS(2 TO 4 MG) BY MOUTH AT BEDTIME     Neurology:  Parkinsonian Agents Passed - 12/02/2017 12:59 PM      Passed - Last BP in normal range    BP Readings from Last 1 Encounters:  07/28/17 118/74         Passed - Valid encounter within last 12 months    Recent Outpatient Visits          Today Chronic midline low back pain without sciatica   Primary Care at [facility], [place], PA-C   4 months ago Cough   Primary Care at [place], PA-C   1 year ago LRTI (lower respiratory tract infection)   Primary Care at [place], PA-C   1 year ago Encounter for initial preventive physical examination covered by [facility]   Primary Care at [place], PA-C   1 year ago Moderate episode of recurrent major depressive disorder [facility])   Primary Care at [place], PA-C

## 2017-12-07 ENCOUNTER — Telehealth: Payer: Self-pay | Admitting: Physician Assistant

## 2017-12-07 NOTE — Telephone Encounter (Addendum)
Opened in error

## 2017-12-12 ENCOUNTER — Other Ambulatory Visit: Payer: Self-pay | Admitting: Family Medicine

## 2017-12-12 NOTE — Telephone Encounter (Signed)
Requested medication (s) are due for refill today: yes  Requested medication (s) are on the active medication list: yes  Last refill:  Zoloft 10/23/17  #30  0 refills                  Lisinopril 10/23/17  #30  0 refills                  Seroquel 10/23/17  #30  0 refills  Medication not delegated  Future visit scheduled: No  Notes to clinic:  Last visit was with B. Timmothy Euler 12/02/17    Requested Prescriptions  Pending Prescriptions Disp Refills   sertraline (ZOLOFT) 100 MG tablet [Pharmacy Med Name: Sertraline HCl 100mg  Tablet] 30 tablet 11    Sig: Take 1 tablet by mouth every day     Psychiatry:  Antidepressants - SSRI Passed - 12/12/2017  1:16 PM      Passed - Completed PHQ-2 or PHQ-9 in the last 360 days.      Passed - Valid encounter within last 6 months    Recent Outpatient Visits          1 week ago Chronic midline low back pain without sciatica   Primary Care at [facility], [place] D, PA-C   4 months ago Cough   Primary Care at [place], PA-C   1 year ago LRTI (lower respiratory tract infection)   Primary Care at [place], PA-C   1 year ago Encounter for initial preventive physical examination covered by [facility]   Primary Care at [place], PA-C   1 year ago Moderate episode of recurrent major depressive disorder [facility])   Primary Care at [place], PA-C            QUEtiapine (SEROQUEL) 200 MG tablet [Pharmacy Med Name: Quetiapine Fumarate 200mg  Tablet] 30 tablet 11    Sig: Take 1 tablet by mouth at bedtime     Not Delegated - Psychiatry:  Antipsychotics - Second Generation (Atypical) - quetiapine Failed - 12/12/2017  1:16 PM      Failed - This refill cannot be delegated      Failed - ALT in normal range and within 180 days    ALT  Date Value Ref Range Status  12/20/2015 12 6 - 29 U/L Final         Failed - AST in normal range and within 180 days    AST  Date Value Ref Range Status  12/20/2015 18 10  - 35 U/L Final         Failed - Last BP in normal range    BP Readings from Last 1 Encounters:  12/07/17 (!) 159/105         Passed - Completed PHQ-2 or PHQ-9 in the last 360 days.      Passed - Valid encounter within last 6 months    Recent Outpatient Visits          1 week ago Chronic midline low back pain without sciatica   Primary Care at [facility], [place], PA-C   4 months ago Cough   Primary Care at [place], PA-C   1 year ago LRTI (lower respiratory tract infection)   Primary Care at [place], PA-C   1 year ago Encounter for initial preventive physical examination covered by [employer]   Primary Care at [place], PA-C   1 year  ago Moderate episode of recurrent major depressive disorder [facility])   Primary Care at [place], Audrie Lia, PA-C            lisinopril (PRINIVIL,ZESTRIL) 10 MG tablet [Pharmacy Med Name: Lisinopril 10mg  Tablet] 30 tablet 11    Sig: Take 1 tablet by mouth every day     Cardiovascular:  ACE Inhibitors Failed - 12/12/2017  1:16 PM      Failed - Cr in normal range and within 180 days    Creat  Date Value Ref Range Status  12/20/2015 0.70 0.50 - 1.10 mg/dL Final         Failed - K in normal range and within 180 days    Potassium  Date Value Ref Range Status  12/20/2015 4.3 3.5 - 5.3 mmol/L Final         Failed - Last BP in normal range    BP Readings from Last 1 Encounters:  12/07/17 (!) 159/105         Passed - Patient is not pregnant      Passed - Valid encounter within last 6 months    Recent Outpatient Visits          1 week ago Chronic midline low back pain without sciatica   Primary Care at [place], [place] D, PA-C   4 months ago Cough   Primary Care at [place], PA-C   1 year ago LRTI (lower respiratory tract infection)   Primary Care at [place], PA-C   1 year ago Encounter for initial preventive physical examination covered by [facility]    Primary Care at [place], PA-C   1 year ago Moderate episode of recurrent major depressive disorder [facility])   Primary Care at [facility], Audrie Lia, PA-C

## 2017-12-21 NOTE — Telephone Encounter (Signed)
Pt referred to psych at last visit.

## 2017-12-29 ENCOUNTER — Other Ambulatory Visit: Payer: Self-pay | Admitting: Physician Assistant

## 2018-01-26 ENCOUNTER — Other Ambulatory Visit: Payer: Self-pay | Admitting: Physician Assistant

## 2018-01-26 DIAGNOSIS — G8929 Other chronic pain: Secondary | ICD-10-CM

## 2018-01-26 DIAGNOSIS — M545 Low back pain: Principal | ICD-10-CM

## 2018-01-26 NOTE — Telephone Encounter (Signed)
Requested medication (s) are due for refill today: yes  Requested medication (s) are on the active medication list: yes  Last refill:12/02/17  Future visit scheduled: no  Notes to clinic:  Not delegated    Requested Prescriptions  Pending Prescriptions Disp Refills   tiZANidine (ZANAFLEX) 4 MG tablet [Pharmacy Med Name: TIZANIDINE 4MG  TABLETS] 120 tablet 1    Sig: TAKE 1 TABLET BY MOUTH EVERY 6 HOURS AS NEEDED FOR MUSCLE SPASMS     Not Delegated - Cardiovascular:  Alpha-2 Agonists - tizanidine Failed - 01/26/2018  3:45 AM      Failed - This refill cannot be delegated      Passed - Valid encounter within last 6 months    Recent Outpatient Visits          1 month ago Chronic midline low back pain without sciatica   Primary Care at [facility], [place], PA-C   6 months ago Cough   Primary Care at [place], PA-C   1 year ago LRTI (lower respiratory tract infection)   Primary Care at [place], Audrie Lia, PA-C   1 year ago Encounter for initial preventive physical examination covered by [facility]   Primary Care at [place], PA-C   1 year ago Moderate episode of recurrent major depressive disorder [facility])   Primary Care at [facility], Audrie Lia, PA-C

## 2018-01-29 NOTE — Telephone Encounter (Signed)
Please advise 

## 2018-01-30 NOTE — Telephone Encounter (Signed)
Please schedule patient to establish care as patient's previous pcp in longer with this practice. thanks 

## 2018-02-25 ENCOUNTER — Other Ambulatory Visit: Payer: Self-pay | Admitting: Physician Assistant

## 2018-03-28 ENCOUNTER — Other Ambulatory Visit: Payer: Self-pay | Admitting: Physician Assistant

## 2018-03-28 DIAGNOSIS — M545 Low back pain: Principal | ICD-10-CM

## 2018-03-28 DIAGNOSIS — G8929 Other chronic pain: Secondary | ICD-10-CM

## 2018-04-10 ENCOUNTER — Other Ambulatory Visit: Payer: Self-pay | Admitting: Physician Assistant

## 2018-04-10 DIAGNOSIS — M545 Low back pain, unspecified: Secondary | ICD-10-CM

## 2018-04-10 DIAGNOSIS — G8929 Other chronic pain: Secondary | ICD-10-CM

## 2018-06-10 ENCOUNTER — Telehealth: Payer: Self-pay | Admitting: Registered Nurse

## 2018-06-10 ENCOUNTER — Encounter: Payer: Self-pay | Admitting: Registered Nurse

## 2018-06-10 ENCOUNTER — Other Ambulatory Visit: Payer: Self-pay

## 2018-06-10 ENCOUNTER — Other Ambulatory Visit: Payer: Self-pay | Admitting: Registered Nurse

## 2018-06-10 ENCOUNTER — Telehealth (INDEPENDENT_AMBULATORY_CARE_PROVIDER_SITE_OTHER): Payer: Medicare Other | Admitting: Registered Nurse

## 2018-06-10 DIAGNOSIS — R7303 Prediabetes: Secondary | ICD-10-CM

## 2018-06-10 DIAGNOSIS — Z7689 Persons encountering health services in other specified circumstances: Secondary | ICD-10-CM

## 2018-06-10 DIAGNOSIS — Z131 Encounter for screening for diabetes mellitus: Secondary | ICD-10-CM

## 2018-06-10 DIAGNOSIS — Z716 Tobacco abuse counseling: Secondary | ICD-10-CM

## 2018-06-10 DIAGNOSIS — F331 Major depressive disorder, recurrent, moderate: Secondary | ICD-10-CM

## 2018-06-10 DIAGNOSIS — J441 Chronic obstructive pulmonary disease with (acute) exacerbation: Secondary | ICD-10-CM

## 2018-06-10 DIAGNOSIS — G2581 Restless legs syndrome: Secondary | ICD-10-CM

## 2018-06-10 DIAGNOSIS — G8929 Other chronic pain: Secondary | ICD-10-CM

## 2018-06-10 DIAGNOSIS — Z13220 Encounter for screening for lipoid disorders: Secondary | ICD-10-CM

## 2018-06-10 DIAGNOSIS — F5104 Psychophysiologic insomnia: Secondary | ICD-10-CM

## 2018-06-10 DIAGNOSIS — I10 Essential (primary) hypertension: Secondary | ICD-10-CM

## 2018-06-10 DIAGNOSIS — M545 Low back pain, unspecified: Secondary | ICD-10-CM

## 2018-06-10 DIAGNOSIS — G43809 Other migraine, not intractable, without status migrainosus: Secondary | ICD-10-CM

## 2018-06-10 MED ORDER — GABAPENTIN 600 MG PO TABS: ORAL_TABLET | ORAL | 11 refills | Status: DC

## 2018-06-10 MED ORDER — TOPIRAMATE 25 MG PO TABS: 25.0000 mg | ORAL_TABLET | Freq: Two times a day (BID) | ORAL | 0 refills | Status: DC

## 2018-06-10 MED ORDER — METOPROLOL TARTRATE 50 MG PO TABS: 50.0000 mg | ORAL_TABLET | Freq: Two times a day (BID) | ORAL | 0 refills | Status: DC

## 2018-06-10 MED ORDER — FLUTICASONE-SALMETEROL 115-21 MCG/ACT IN AERO: 2.0000 | INHALATION_SPRAY | Freq: Two times a day (BID) | RESPIRATORY_TRACT | 6 refills | Status: DC

## 2018-06-10 MED ORDER — ALBUTEROL SULFATE HFA 108 (90 BASE) MCG/ACT IN AERS: INHALATION_SPRAY | RESPIRATORY_TRACT | 14 refills | Status: DC

## 2018-06-10 MED ORDER — VARENICLINE TARTRATE 0.5 MG PO TABS: 0.5000 mg | ORAL_TABLET | Freq: Two times a day (BID) | ORAL | 0 refills | Status: DC

## 2018-06-10 MED ORDER — LISINOPRIL 10 MG PO TABS: 10.0000 mg | ORAL_TABLET | Freq: Every day | ORAL | 0 refills | Status: DC

## 2018-06-10 MED ORDER — TIZANIDINE HCL 4 MG PO TABS: ORAL_TABLET | ORAL | 0 refills | Status: DC

## 2018-06-10 MED ORDER — ROPINIROLE HCL 2 MG PO TABS: ORAL_TABLET | ORAL | 0 refills | Status: DC

## 2018-06-10 MED ORDER — QUETIAPINE FUMARATE 200 MG PO TABS: 200.0000 mg | ORAL_TABLET | Freq: Every day | ORAL | 0 refills | Status: DC

## 2018-06-10 MED ORDER — SERTRALINE HCL 100 MG PO TABS: 100.0000 mg | ORAL_TABLET | Freq: Every day | ORAL | 0 refills | Status: DC

## 2018-06-10 MED ORDER — AZITHROMYCIN 250 MG PO TABS: 500.0000 mg | ORAL_TABLET | Freq: Every day | ORAL | 0 refills | Status: AC

## 2018-06-10 MED ORDER — TIOTROPIUM BROMIDE MONOHYDRATE 18 MCG IN CAPS: 18.0000 ug | ORAL_CAPSULE | Freq: Every day | RESPIRATORY_TRACT | 6 refills | Status: DC

## 2018-06-10 NOTE — Patient Instructions (Signed)
° ° ° °  If you have lab work done today you will be contacted with your lab results within the next 2 weeks.  If you have not heard from us then please contact us. The fastest way to get your results is to register for My Chart. ° ° °IF you received an x-ray today, you will receive an invoice from [facility]. Please contact [facility] at [phone] with questions or concerns regarding your invoice.  ° °IF you received labwork today, you will receive an invoice from [facility]. Please contact [facility] at [phone] with questions or concerns regarding your invoice.  ° °Our billing staff will not be able to assist you with questions regarding bills from these companies. ° °You will be contacted with the lab results as soon as they are available. The fastest way to get your results is to activate your My Chart account. Instructions are located on the last page of this paperwork. If you have not heard from us regarding the results in 2 weeks, please contact this office. °  ° ° ° °

## 2018-06-10 NOTE — Telephone Encounter (Signed)
06/10/2018 - PATIENT HAD A TELEMED VISIT WITH RICH MORROW ON Wednesday 06/10/2018. RICH HAS REQUESTED PATIENT HAVE A NURSE ONLY VISIT TO HAVE HER LABS DRAWN AND TO CHECK HER VITAL SIGNS. I TRIED TO CALL AND SCHEDULE BUT HAD TO LEAVE A MESSAGE ON HER VOICE MAIL. [place]

## 2018-06-10 NOTE — Progress Notes (Signed)
Telemedicine Encounter- SOAP NOTE Established Patient  This telephone encounter was conducted with the patient's (or proxy's) verbal consent via audio telecommunications: yes  Patient was instructed to have this encounter in a suitably private space; and to only have persons present to whom they give permission to participate. In addition, patient identity was confirmed by use of name plus two identifiers (DOB and address).  I discussed the limitations, risks, security and privacy concerns of performing an evaluation and management service by telephone and the availability of in person appointments. I also discussed with the patient that there may be a patient responsible charge related to this service. The patient expressed understanding and agreed to proceed.  I spent a total of 35 minutes talking with the patient or their proxy.  CC: Pt presents to establish care and med refills  Subjective   Rita Hunter is a 48 y.o. established patient. Telephone visit today for establishing care with myself, follow up on medical conditions, med refills, and referrals  Pt has history significant for: HTN, Chronic pain, COPD, Insomnia, RLS, Depressive disorder, migraine, and interest in smoking cessation, among others.  Pt has shx significant for lumbar laminectomy and hernia repair.  Pt formerly of [name], PA-C and [place], PA-C.  HTN: Pt currently taking lisinopril 10mg  PO daily and metoprolol 50mg  PO bid. Pt states that she monitors her BP at home and it has been running high. She will get vitals taken when she presents to clinic for labs  Chronic Pain: Pt endorses severe lower back pain that interferes with ADLs and sleep. Currently given zanaflex 4mg  PO q6h PRN. Pt reports that she has received [facility] for pain in the past, however, it has not been prescribed at this clinic. Pt is agreeable to an ambulatory referral to pain management.  COPD: Pt dx in 2013. Current 0.5 ppd smoker  with 10 pack year history. Endorses an increase in SOB, sputum production, and sputum purulence. Denies fever, headache, cough, sinus pressure, hypoxia. Denies known exposure to respiratory infection, states she has been consistent about social distancing. Suspect COPD exacerbation over COVID-19 at this time.  Insomnia: Pt taking seroquel and requip for insomnia. Reports that they are only mildly effective. She feels that she only gets 2 hours of sleep per night d/t insomnia and back pain.   RLS: Taking gabapentin x 5 years with good effect.  Depressive Disorder: Taking Zoloft 100 mg PO daily with good effect.  Migraine: Currently taking Topamax 25mg  PO bid with good effect.  Smoking Cessation: Pt started on Chantix. Has trialed before with good effect. Will continue to support and encourage patient.      Patient Active Problem List   Diagnosis Date Noted  . Chronic pain syndrome 12/26/2015  . High risk medication use 12/26/2015  . Mood disorder ([place]) 12/26/2015  . Essential hypertension 12/26/2015  . Moderate episode of recurrent major depressive disorder ([place]) 12/26/2015  . Cysts of both ovaries 12/26/2015  . Migraine 12/26/2015    Past Medical History:  Diagnosis Date  . Allergy   . Anxiety   . Asthma   . COPD (chronic obstructive pulmonary disease) ([place])   . Depression   . Emphysema of lung ([place])   . High blood pressure   . Neuromuscular disorder [facility])     Current Outpatient Medications  Medication Sig Dispense Refill  . albuterol (PROAIR HFA) 108 (90 Base) MCG/ACT inhaler Inhale 2 puffs by mouth every 4 hours as needed for wheezing or  shortness of breath 8.5 each 14  . fluticasone-salmeterol (ADVAIR HFA) 115-21 MCG/ACT inhaler Inhale 2 puffs into the lungs 2 (two) times daily. 12 Inhaler 6  . gabapentin (NEURONTIN) 600 MG tablet TAKE ONE TABLET BY MOUTH 3 TIMES A DAY 90 tablet 11  . lisinopril (ZESTRIL) 10 MG tablet Take 1 tablet (10 mg total) by mouth daily. 30 tablet  0  . metoprolol tartrate (LOPRESSOR) 50 MG tablet Take 1 tablet (50 mg total) by mouth 2 (two) times daily. 60 tablet 0  . QUEtiapine (SEROQUEL) 200 MG tablet Take 1 tablet (200 mg total) by mouth at bedtime. 30 tablet 0  . rOPINIRole (REQUIP) 2 MG tablet Take 1-2 (2-4mg ) tablets by mouth at bedtime 60 tablet 0  . sertraline (ZOLOFT) 100 MG tablet Take 1 tablet (100 mg total) by mouth daily. 30 tablet 0  . tiotropium (SPIRIVA) 18 MCG inhalation capsule Place 1 capsule (18 mcg total) into inhaler and inhale daily. 30 capsule 6  . tiZANidine (ZANAFLEX) 4 MG tablet TAKE ONE TABLET BY MOUTH EVERY 6 HOURS AS NEEDED FOR MUSCLE SPASMS 60 tablet 0  . topiramate (TOPAMAX) 25 MG tablet Take 1 tablet (25 mg total) by mouth 2 (two) times daily. 60 tablet 0  . azithromycin (ZITHROMAX) 250 MG tablet Take 2 tablets (500 mg total) by mouth daily for 3 days. 6 tablet 0  . varenicline (CHANTIX) 0.5 MG tablet Take 1 tablet (0.5 mg total) by mouth 2 (two) times daily. 60 tablet 0   No current facility-administered medications for this visit.     Allergies  Allergen Reactions  . Mobic [Meloxicam] Swelling    Swelling of tongue   . Ultram [Tramadol Hcl] Swelling    Swelling of tongue    Social History   Socioeconomic History  . Marital status: Divorced    Spouse name: Not on file  . Number of children: 1  . Years of education: Not on file  . Highest education level: Not on file  Occupational History  . Occupation: ssi  Social Needs  . Financial resource strain: Not very hard  . Food insecurity:    Worry: Never true    Inability: Never true  . Transportation needs:    Medical: No    Non-medical: No  Tobacco Use  . Smoking status: Current Every Day Smoker    Packs/day: 0.50    Types: Cigarettes    Start date: 06/10/1998  . Smokeless tobacco: Never Used  Substance and Sexual Activity  . Alcohol use: No  . Drug use: No  . Sexual activity: Yes    Birth control/protection: None  Lifestyle  .  Physical activity:    Days per week: 0 days    Minutes per session: 0 min  . Stress: To some extent  Relationships  . Social connections:    Talks on phone: Three times a week    Gets together: Twice a week    Attends religious service: Never    Active member of club or organization: No    Attends meetings of clubs or organizations: Never    Relationship status: Divorced  . Intimate partner violence:    Fear of current or ex partner: No    Emotionally abused: No    Physically abused: No    Forced sexual activity: No  Other Topics Concern  . Not on file  Social History Narrative  . Not on file    Review of Systems  Constitutional: Negative for chills, fever and  malaise/fatigue.  HENT: Positive for congestion. Negative for sinus pain and sore throat.   Respiratory: Positive for cough (productive: yellow purulent sputum) and sputum production (yellow purulent). Negative for hemoptysis, shortness of breath and wheezing.   Cardiovascular: Negative for chest pain.  Gastrointestinal: Positive for nausea.    Objective   Vitals as reported by the patient: There were no vitals filed for this visit.  Diagnoses and all orders for this visit:  Essential hypertension -     Comprehensive metabolic panel; Future -     lisinopril (ZESTRIL) 10 MG tablet; Take 1 tablet (10 mg total) by mouth daily. -     metoprolol tartrate (LOPRESSOR) 50 MG tablet; Take 1 tablet (50 mg total) by mouth 2 (two) times daily.  Encounter to establish care -     Vital signs; Future  COPD exacerbation (HCC) -     fluticasone-salmeterol (ADVAIR HFA) 115-21 MCG/ACT inhaler; Inhale 2 puffs into the lungs 2 (two) times daily. -     albuterol (PROAIR HFA) 108 (90 Base) MCG/ACT inhaler; Inhale 2 puffs by mouth every 4 hours as needed for wheezing or shortness of breath -     tiotropium (SPIRIVA) 18 MCG inhalation capsule; Place 1 capsule (18 mcg total) into inhaler and inhale daily. -     azithromycin (ZITHROMAX)  250 MG tablet; Take 2 tablets (500 mg total) by mouth daily for 3 days. -     Ambulatory referral to Pulmonology -     CBC with Differential; Future  Encounter for smoking cessation counseling -     varenicline (CHANTIX) 0.5 MG tablet; Take 1 tablet (0.5 mg total) by mouth 2 (two) times daily. -     CBC with Differential; Future  Chronic midline low back pain without sciatica -     tiZANidine (ZANAFLEX) 4 MG tablet; TAKE ONE TABLET BY MOUTH EVERY 6 HOURS AS NEEDED FOR MUSCLE SPASMS -     Ambulatory referral to Pain Clinic  Screening for diabetes mellitus -     Hemoglobin A1c; Future -     CBC with Differential; Future  Prediabetes -     Hemoglobin A1c; Future  Screening for cholesterol level -     Lipid Panel; Future  Other migraine without status migrainosus, not intractable -     topiramate (TOPAMAX) 25 MG tablet; Take 1 tablet (25 mg total) by mouth 2 (two) times daily.  Chronic insomnia -     QUEtiapine (SEROQUEL) 200 MG tablet; Take 1 tablet (200 mg total) by mouth at bedtime. -     rOPINIRole (REQUIP) 2 MG tablet; Take 1-2 (2-4mg ) tablets by mouth at bedtime  Moderate episode of recurrent major depressive disorder (HCC) -     sertraline (ZOLOFT) 100 MG tablet; Take 1 tablet (100 mg total) by mouth daily.  RLS (restless legs syndrome) -     gabapentin (NEURONTIN) 600 MG tablet; TAKE ONE TABLET BY MOUTH 3 TIMES A DAY  PLAN:  Reorder 30 day supply of meds as courtesy until pt can present to clinic for labs and vitals  Azithromycin 500mg  PO qd for three days. Strong suspicion of COPD exacerbation given 3/3 symptoms: increased SOB, increased sputum, increased sputum purulence. Not strongly suspicious for COVID-19 at this time. Pt encouraged to call clinic with any further infection concerns, reviewed sxs.  Referral to Pulmonology: Pt has not established care with pulmonology. Although she is not particularly high risk for complication, she had interest in establishing care  at this time  Referral to Pain Management: Pt has been managed with opiates in the past, however, not at our office. She is in agreement with plan for outpatient pain management referral at this time.  Pt encouraged to contact clinic with any questions, comments, or concerns.    I discussed the assessment and treatment plan with the patient. The patient was provided an opportunity to ask questions and all were answered. The patient agreed with the plan and demonstrated an understanding of the instructions.   The patient was advised to call back or seek an in-person evaluation if the symptoms worsen or if the condition fails to improve as anticipated.  I provided 35 minutes of non-face-to-face time during this encounter.  Maximiano Coss, NP  Primary Care at [facility]

## 2018-06-10 NOTE — Progress Notes (Signed)
CC-[place] [facility]/ Hypertension, Migraine, hyperlipidemia, Nerve pain, and Asthma. Needs refills on all medication. Patient was informed we may just give her 30 day refills until she is able to come into the office to get labs done. Patient stated she needed to get a ride and will call back when we can schedule the lab appt. I did put some labs in for future labs feel free to add more labs if I forgot a lab.

## 2018-06-11 ENCOUNTER — Telehealth: Payer: Self-pay | Admitting: Registered Nurse

## 2018-06-11 NOTE — Telephone Encounter (Signed)
Copied from [place] [id number]. Topic: Referral - Status >> Jun 10, 2018  4:14 PM Ivar Drape wrote: Reason for CRM:  Inez Catalina w/[facility] [phone] stated that the patient is a former patient of theirs and they cannot prescribe narcotics for her.  So they don't know if she would really want to see them.  Please advise.

## 2018-06-11 NOTE — Telephone Encounter (Signed)
Please send to another place thanks.

## 2018-06-27 ENCOUNTER — Other Ambulatory Visit: Payer: Self-pay | Admitting: Physician Assistant

## 2018-06-27 DIAGNOSIS — G2581 Restless legs syndrome: Secondary | ICD-10-CM

## 2018-07-07 ENCOUNTER — Other Ambulatory Visit: Payer: Self-pay | Admitting: Physician Assistant

## 2018-07-07 DIAGNOSIS — J441 Chronic obstructive pulmonary disease with (acute) exacerbation: Secondary | ICD-10-CM

## 2018-07-27 ENCOUNTER — Other Ambulatory Visit: Payer: Self-pay | Admitting: Physician Assistant

## 2018-07-27 DIAGNOSIS — G2581 Restless legs syndrome: Secondary | ICD-10-CM

## 2018-08-06 ENCOUNTER — Other Ambulatory Visit: Payer: Self-pay | Admitting: Physician Assistant

## 2018-08-06 DIAGNOSIS — G2581 Restless legs syndrome: Secondary | ICD-10-CM

## 2018-08-06 DIAGNOSIS — J441 Chronic obstructive pulmonary disease with (acute) exacerbation: Secondary | ICD-10-CM

## 2018-08-24 ENCOUNTER — Institutional Professional Consult (permissible substitution): Payer: Medicare Other | Admitting: Pulmonary Disease

## 2018-08-27 ENCOUNTER — Other Ambulatory Visit: Payer: Self-pay | Admitting: Physician Assistant

## 2018-08-27 DIAGNOSIS — G2581 Restless legs syndrome: Secondary | ICD-10-CM

## 2018-09-12 ENCOUNTER — Other Ambulatory Visit: Payer: Self-pay | Admitting: Registered Nurse

## 2018-09-12 DIAGNOSIS — F5104 Psychophysiologic insomnia: Secondary | ICD-10-CM

## 2018-09-12 DIAGNOSIS — I10 Essential (primary) hypertension: Secondary | ICD-10-CM

## 2018-09-12 DIAGNOSIS — F331 Major depressive disorder, recurrent, moderate: Secondary | ICD-10-CM

## 2018-09-12 DIAGNOSIS — G43809 Other migraine, not intractable, without status migrainosus: Secondary | ICD-10-CM

## 2018-09-14 NOTE — Telephone Encounter (Signed)
Pt had a tele-med 06/10/18 and requesting refills and no f/u appt schedule. Please advise on refill

## 2018-09-14 NOTE — Telephone Encounter (Signed)
Ms Lafever would need to schedule a visit to have some labs done and to be seen for a follow up before I give refills at this time. Unfortunately, we had discussed getting labs drawn within 30 days of her visit on 06/10/18, and it appears that they haven't been done yet.  If you could call the patient to let her know, that would be great, thank you,  Kathrin Ruddy, NP

## 2018-09-14 NOTE — Telephone Encounter (Signed)
I have called the pt back and informed her of the message below. She stated understanding and will call us back next week to try to get an appointment.   After she is seen and lab received then we can refill some medications.

## 2018-09-14 NOTE — Telephone Encounter (Signed)
Pt is requesting mediction

## 2018-10-07 ENCOUNTER — Other Ambulatory Visit: Payer: Self-pay | Admitting: Physician Assistant

## 2018-10-07 ENCOUNTER — Institutional Professional Consult (permissible substitution): Payer: Medicare Other | Admitting: Internal Medicine

## 2018-10-07 DIAGNOSIS — I10 Essential (primary) hypertension: Secondary | ICD-10-CM

## 2018-10-07 DIAGNOSIS — G43809 Other migraine, not intractable, without status migrainosus: Secondary | ICD-10-CM

## 2018-10-07 NOTE — Telephone Encounter (Signed)
Requested medication (s) are due for refill today: yes  Requested medication (s) are on the active medication list: yes  Last refill:  06/10/2018  Future visit scheduled: no  Notes to clinic:  This refill cannot be delegated   Requested Prescriptions  Pending Prescriptions Disp Refills   topiramate (TOPAMAX) 25 MG tablet [Pharmacy Med Name: Topiramate 25mg  Tablet] 60 tablet 11    Sig: TAKE ONE TABLET BY MOUTH TWICE DAILY     Not Delegated - Neurology: Anticonvulsants - topiramate & zonisamide Failed - 10/07/2018  7:19 AM      Failed - This refill cannot be delegated      Failed - Cr in normal range and within 360 days    Creat  Date Value Ref Range Status  12/20/2015 0.70 0.50 - 1.10 mg/dL Final         Failed - CO2 in normal range and within 360 days    CO2  Date Value Ref Range Status  12/20/2015 24 20 - 31 mmol/L Final         Passed - Valid encounter within last 12 months    Recent Outpatient Visits          10 months ago Chronic midline low back pain without sciatica   [facility], [place] D, PA-C   1 year ago Cough   [facility] at [place], PA-C   1 year ago LRTI (lower respiratory tract infection)   [facility], Audrie Lia, PA-C   2 years ago Encounter for initial preventive physical examination covered by [facility]   [facility] at [place], PA-C   2 years ago Moderate episode of recurrent major depressive disorder [facility])   Primary Care at [place], PA-C

## 2018-10-07 NOTE — Telephone Encounter (Signed)
Left a vm for patient to contact office to schedule appointment

## 2018-10-09 ENCOUNTER — Other Ambulatory Visit: Payer: Self-pay | Admitting: Registered Nurse

## 2018-10-09 DIAGNOSIS — G43809 Other migraine, not intractable, without status migrainosus: Secondary | ICD-10-CM

## 2018-10-09 NOTE — Telephone Encounter (Signed)
Requested medication (s) are due for refill today: yes  Requested medication (s) are on the active medication list: yes  Last refill:  10/08/2018  Future visit scheduled: no  Notes to clinic: requesting refills with script   Requested Prescriptions  Pending Prescriptions Disp Refills   topiramate (TOPAMAX) 25 MG tablet [Pharmacy Med Name: Topiramate 25mg  Tablet] 60 tablet 11    Sig: Take 1 tablet by mouth twice daily     Not Delegated - Neurology: Anticonvulsants - topiramate & zonisamide Failed - 10/09/2018  7:31 AM      Failed - This refill cannot be delegated      Failed - Cr in normal range and within 360 days    Creat  Date Value Ref Range Status  12/20/2015 0.70 0.50 - 1.10 mg/dL Final         Failed - CO2 in normal range and within 360 days    CO2  Date Value Ref Range Status  12/20/2015 24 20 - 31 mmol/L Final         Passed - Valid encounter within last 12 months    Recent Outpatient Visits          10 months ago Chronic midline low back pain without sciatica   [facility], [place] D, PA-C   1 year ago Cough   [facility] at [place], PA-C   1 year ago LRTI (lower respiratory tract infection)   [facility], Audrie Lia, PA-C   2 years ago Encounter for initial preventive physical examination covered by [facility]   Primary Care at [place], PA-C   2 years ago Moderate episode of recurrent major depressive disorder [facility])   Primary Care at [place], PA-C

## 2018-10-14 ENCOUNTER — Institutional Professional Consult (permissible substitution): Payer: Medicare Other | Admitting: Internal Medicine

## 2018-10-23 ENCOUNTER — Other Ambulatory Visit: Payer: Self-pay | Admitting: Physician Assistant

## 2018-10-23 DIAGNOSIS — I10 Essential (primary) hypertension: Secondary | ICD-10-CM

## 2018-10-23 NOTE — Telephone Encounter (Signed)
Requested medication (s) are due for refill today: yes  Requested medication (s) are on the active medication list: yes  Last refill:  10/08/2018  Future visit scheduled: no  Notes to clinic:  Review for refill   Requested Prescriptions  Pending Prescriptions Disp Refills   lisinopril (ZESTRIL) 10 MG tablet [Pharmacy Med Name: Lisinopril 10mg  Tablet] 30 tablet 11    Sig: Take 1 tablet by mouth every day     Cardiovascular:  ACE Inhibitors Failed - 10/23/2018  9:08 AM      Failed - Cr in normal range and within 180 days    Creat  Date Value Ref Range Status  12/20/2015 0.70 0.50 - 1.10 mg/dL Final         Failed - K in normal range and within 180 days    Potassium  Date Value Ref Range Status  12/20/2015 4.3 3.5 - 5.3 mmol/L Final         Failed - Last BP in normal range    BP Readings from Last 1 Encounters:  12/07/17 (!) 159/105         Passed - Patient is not pregnant      Passed - Valid encounter within last 6 months    Recent Outpatient Visits          4 months ago Essential hypertension   [facility], [place], NP   10 months ago Chronic midline low back pain without sciatica   [facility], [place] D, PA-C   1 year ago Cough   Primary Care at [place], PA-C   1 year ago LRTI (lower respiratory tract infection)   [facility], Audrie Lia, PA-C   2 years ago Encounter for initial preventive physical examination covered by [facility]   Primary Care at [place], PA-C

## 2018-10-28 ENCOUNTER — Other Ambulatory Visit: Payer: Self-pay | Admitting: Registered Nurse

## 2018-10-28 DIAGNOSIS — G43809 Other migraine, not intractable, without status migrainosus: Secondary | ICD-10-CM

## 2018-10-28 NOTE — Telephone Encounter (Signed)
Copied from [place] [id number]. Topic: Quick Communication - Rx Refill/Question >> Oct 28, 2018 11:52 AM Yvette Rack wrote: Medication: topiramate (TOPAMAX) 25 MG tablet  Has the patient contacted their pharmacy? yes   Preferred Pharmacy (with phone number or street name): [place], [place] [phone] (Phone)  [phone] (Fax)  Agent: Please be advised that RX refills may take up to 3 business days. We ask that you follow-up with your pharmacy.

## 2018-10-28 NOTE — Telephone Encounter (Signed)
Requested medication (s) are due for refill today:yes  Requested medication (s) are on the active medication list: yes  Last refill:  10/07/2018  Future visit scheduled: no  Notes to clinic:  Not delegated    Requested Prescriptions  Pending Prescriptions Disp Refills   topiramate (TOPAMAX) 25 MG tablet 60 tablet 0    Sig: Take 1 tablet (25 mg total) by mouth 2 (two) times daily.     Not Delegated - Neurology: Anticonvulsants - topiramate & zonisamide Failed - 10/28/2018 11:55 AM      Failed - This refill cannot be delegated      Failed - Cr in normal range and within 360 days    Creat  Date Value Ref Range Status  12/20/2015 0.70 0.50 - 1.10 mg/dL Final         Failed - CO2 in normal range and within 360 days    CO2  Date Value Ref Range Status  12/20/2015 24 20 - 31 mmol/L Final         Passed - Valid encounter within last 12 months    Recent Outpatient Visits          4 months ago Essential hypertension   [facility], Delfino Lovett, NP   11 months ago Chronic midline low back pain without sciatica   [facility], [place] D, PA-C   1 year ago Cough   Primary Care at [place], PA-C   1 year ago LRTI (lower respiratory tract infection)   [facility], Audrie Lia, PA-C   2 years ago Encounter for initial preventive physical examination covered by [facility]   Primary Care at [place], PA-C

## 2018-10-30 ENCOUNTER — Other Ambulatory Visit: Payer: Self-pay | Admitting: Registered Nurse

## 2018-10-30 DIAGNOSIS — M545 Low back pain, unspecified: Secondary | ICD-10-CM

## 2018-10-30 DIAGNOSIS — G8929 Other chronic pain: Secondary | ICD-10-CM

## 2018-10-30 DIAGNOSIS — F331 Major depressive disorder, recurrent, moderate: Secondary | ICD-10-CM

## 2018-10-30 DIAGNOSIS — G2581 Restless legs syndrome: Secondary | ICD-10-CM

## 2018-10-30 DIAGNOSIS — J441 Chronic obstructive pulmonary disease with (acute) exacerbation: Secondary | ICD-10-CM

## 2018-10-30 DIAGNOSIS — G43809 Other migraine, not intractable, without status migrainosus: Secondary | ICD-10-CM

## 2018-10-30 DIAGNOSIS — F5104 Psychophysiologic insomnia: Secondary | ICD-10-CM

## 2018-10-30 NOTE — Telephone Encounter (Signed)
Requested medication (s) are due for refill today: yes  Requested medication (s) are on the active medication list: yes   Future visit scheduled: no  Notes to clinic:  Review for refill Multiple medications    Requested Prescriptions  Pending Prescriptions Disp Refills   ADVAIR HFA 115-21 MCG/ACT inhaler [Pharmacy Med Name: Advair HFA 152mcg-21mcg Inhaler] 1 Inhaler 11    Sig: Inhale 2 puffs by mouth twice daily     Pulmonology:  Combination Products Passed - 10/30/2018  3:13 PM      Passed - Valid encounter within last 12 months    Recent Outpatient Visits          4 months ago Essential hypertension   [facility], Delfino Lovett, NP   11 months ago Chronic midline low back pain without sciatica   [facility], [place], PA-C   1 year ago Cough   Primary Care at [place], PA-C   1 year ago LRTI (lower respiratory tract infection)   [facility], Audrie Lia, PA-C   2 years ago Encounter for initial preventive physical examination covered by [facility]   Primary Care at [place], PA-C              gabapentin (NEURONTIN) 600 MG tablet [Pharmacy Med Name: Gabapentin 600mg  Tablet] 90 tablet 11    Sig: TAKE ONE TABLET BY MOUTH 3 TIMES A DAY     Neurology: Anticonvulsants - gabapentin Passed - 10/30/2018  3:13 PM      Passed - Valid encounter within last 12 months    Recent Outpatient Visits          4 months ago Essential hypertension   [facility], Delfino Lovett, NP   11 months ago Chronic midline low back pain without sciatica   [facility], [place] D, PA-C   1 year ago Cough   Primary Care at [place], PA-C   1 year ago LRTI (lower respiratory tract infection)   [facility], Audrie Lia, PA-C   2 years ago Encounter for initial preventive physical examination covered by [facility]   [facility], Audrie Lia, PA-C               QUEtiapine (SEROQUEL) 200 MG tablet [Pharmacy Med Name: Quetiapine Fumarate 200mg  Tablet] 30 tablet 11    Sig: Take 1 tablet by mouth at bedtime     Not Delegated - Psychiatry:  Antipsychotics - Second Generation (Atypical) - quetiapine Failed - 10/30/2018  3:13 PM      Failed - This refill cannot be delegated      Failed - ALT in normal range and within 180 days    ALT  Date Value Ref Range Status  12/20/2015 12 6 - 29 U/L Final         Failed - AST in normal range and within 180 days    AST  Date Value Ref Range Status  12/20/2015 18 10 - 35 U/L Final         Failed - Last BP in normal range    BP Readings from Last 1 Encounters:  12/07/17 (!) 159/105         Passed - Valid encounter within last 6 months    Recent Outpatient Visits          4 months ago Essential hypertension   Primary  Care at [place], NP   11 months ago Chronic midline low back pain without sciatica   Primary Care at [facility], [place], PA-C   1 year ago Cough   Primary Care at [place], PA-C   1 year ago LRTI (lower respiratory tract infection)   Primary Care at [place], PA-C   2 years ago Encounter for initial preventive physical examination covered by [facility]   Primary Care at [place], PA-C             Passed - Completed PHQ-2 or PHQ-9 in the last 360 days.       sertraline (ZOLOFT) 100 MG tablet [Pharmacy Med Name: Sertraline HCl 100mg  Tablet] 30 tablet 11    Sig: Take 1 tablet by mouth every day     Psychiatry:  Antidepressants - SSRI Passed - 10/30/2018  3:13 PM      Passed - Valid encounter within last 6 months    Recent Outpatient Visits          4 months ago Essential hypertension   [facility], [place], NP   11 months ago Chronic midline low back pain without sciatica   [facility], [place], PA-C   1 year ago Cough   Primary Care at [place],  PA-C   1 year ago LRTI (lower respiratory tract infection)   Primary Care at [place], PA-C   2 years ago Encounter for initial preventive physical examination covered by [facility]   Primary Care at [place], PA-C             Passed - Completed PHQ-2 or PHQ-9 in the last 360 days.       tiZANidine (ZANAFLEX) 4 MG tablet [Pharmacy Med Name: Tizanidine HCl 4mg  Tab] 60 tablet 11    Sig: Take 1 tablet by mouth every 6 hours as needed for muscle spasms     Not Delegated - Cardiovascular:  Alpha-2 Agonists - tizanidine Failed - 10/30/2018  3:13 PM      Failed - This refill cannot be delegated      Passed - Valid encounter within last 6 months    Recent Outpatient Visits          4 months ago Essential hypertension   [facility], [place], NP   11 months ago Chronic midline low back pain without sciatica   [facility], [place], PA-C   1 year ago Cough   Primary Care at [place], PA-C   1 year ago LRTI (lower respiratory tract infection)   Primary Care at [place], PA-C   2 years ago Encounter for initial preventive physical examination covered by [facility]   Primary Care at [place], PA-C              topiramate (TOPAMAX) 25 MG tablet [Pharmacy Med Name: Topiramate 25mg  Tablet] 60 tablet 11    Sig: Take 1 tablet by mouth twice daily     Not Delegated - Neurology: Anticonvulsants - topiramate & zonisamide Failed - 10/30/2018  3:13 PM      Failed - This refill cannot be delegated      Failed - Cr in normal range and within 360 days    Creat  Date Value Ref Range Status  12/20/2015 0.70 0.50 - 1.10 mg/dL  Final         Failed - CO2 in normal range and within 360 days    CO2  Date Value Ref Range Status  12/20/2015 24 20 - 31 mmol/L Final         Passed - Valid encounter within last 12 months    Recent Outpatient Visits          4 months ago Essential  hypertension   [facility], [place], NP   11 months ago Chronic midline low back pain without sciatica   [facility], [place], PA-C   1 year ago Cough   Primary Care at [place], PA-C   1 year ago LRTI (lower respiratory tract infection)   [facility], Audrie Lia, PA-C   2 years ago Encounter for initial preventive physical examination covered by [facility]   Primary Care at [place], PA-C

## 2018-11-03 ENCOUNTER — Other Ambulatory Visit: Payer: Self-pay | Admitting: Registered Nurse

## 2018-11-03 DIAGNOSIS — F5104 Psychophysiologic insomnia: Secondary | ICD-10-CM

## 2018-11-03 DIAGNOSIS — G2581 Restless legs syndrome: Secondary | ICD-10-CM

## 2018-11-03 DIAGNOSIS — G8929 Other chronic pain: Secondary | ICD-10-CM

## 2018-11-03 DIAGNOSIS — G43809 Other migraine, not intractable, without status migrainosus: Secondary | ICD-10-CM

## 2018-11-03 DIAGNOSIS — F331 Major depressive disorder, recurrent, moderate: Secondary | ICD-10-CM

## 2018-11-03 DIAGNOSIS — J441 Chronic obstructive pulmonary disease with (acute) exacerbation: Secondary | ICD-10-CM

## 2018-11-03 NOTE — Telephone Encounter (Signed)
Requested medication (s) are due for refill today: yes  Requested medication (s) are on the active medication list: yes  Last refill:  06/10/2018  Future visit scheduled: no  Notes to clinic: review for refill   Requested Prescriptions  Pending Prescriptions Disp Refills   fluticasone-salmeterol (ADVAIR HFA) 115-21 MCG/ACT inhaler 12 Inhaler 6    Sig: Inhale 2 puffs into the lungs 2 (two) times daily.     Pulmonology:  Combination Products Passed - 11/03/2018  9:59 AM      Passed - Valid encounter within last 12 months    Recent Outpatient Visits          4 months ago Essential hypertension   [facility], [place], NP   11 months ago Chronic midline low back pain without sciatica   [facility], [place], PA-C   1 year ago Cough   Primary Care at [place], PA-C   1 year ago LRTI (lower respiratory tract infection)   [facility], Audrie Lia, PA-C   2 years ago Encounter for initial preventive physical examination covered by [facility]   [facility] at [facility], Audrie Lia, PA-C              tiZANidine (ZANAFLEX) 4 MG tablet 60 tablet 0    Sig: TAKE ONE TABLET BY MOUTH EVERY 6 HOURS AS NEEDED FOR MUSCLE SPASMS     Not Delegated - Cardiovascular:  Alpha-2 Agonists - tizanidine Failed - 11/03/2018  9:59 AM      Failed - This refill cannot be delegated      Passed - Valid encounter within last 6 months    Recent Outpatient Visits          4 months ago Essential hypertension   [facility], Delfino Lovett, NP   11 months ago Chronic midline low back pain without sciatica   Primary Care at [place], [place], PA-C   1 year ago Cough   Primary Care at [place], PA-C   1 year ago LRTI (lower respiratory tract infection)   [facility], Audrie Lia, PA-C   2 years ago Encounter for initial preventive physical examination covered by [facility]   [facility] at  [facility], Audrie Lia, PA-C              topiramate (TOPAMAX) 25 MG tablet 60 tablet 0    Sig: Take 1 tablet (25 mg total) by mouth 2 (two) times daily.     Not Delegated - Neurology: Anticonvulsants - topiramate & zonisamide Failed - 11/03/2018  9:59 AM      Failed - This refill cannot be delegated      Failed - Cr in normal range and within 360 days    Creat  Date Value Ref Range Status  12/20/2015 0.70 0.50 - 1.10 mg/dL Final         Failed - CO2 in normal range and within 360 days    CO2  Date Value Ref Range Status  12/20/2015 24 20 - 31 mmol/L Final         Passed - Valid encounter within last 12 months    Recent Outpatient Visits          4 months ago Essential hypertension   Primary Care at [place], NP   11 months ago Chronic midline low back pain without sciatica   Primary  Care at [facility], [place], PA-C   1 year ago Cough   Primary Care at [place], PA-C   1 year ago LRTI (lower respiratory tract infection)   Primary Care at [place], PA-C   2 years ago Encounter for initial preventive physical examination covered by [facility]   Primary Care at [place], PA-C              gabapentin (NEURONTIN) 600 MG tablet 90 tablet 11    Sig: TAKE ONE TABLET BY MOUTH 3 TIMES A DAY     Neurology: Anticonvulsants - gabapentin Passed - 11/03/2018  9:59 AM      Passed - Valid encounter within last 12 months    Recent Outpatient Visits          4 months ago Essential hypertension   [facility], Delfino Lovett, NP   11 months ago Chronic midline low back pain without sciatica   [facility], [place], PA-C   1 year ago Cough   Primary Care at [place], PA-C   1 year ago LRTI (lower respiratory tract infection)   [facility], Audrie Lia, PA-C   2 years ago Encounter for initial preventive physical examination covered by [facility]   [facility] at [facility], Audrie Lia, PA-C              sertraline (ZOLOFT) 100 MG tablet 30 tablet 0    Sig: Take 1 tablet (100 mg total) by mouth daily.     Psychiatry:  Antidepressants - SSRI Passed - 11/03/2018  9:59 AM      Passed - Valid encounter within last 6 months    Recent Outpatient Visits          4 months ago Essential hypertension   [facility], [place], NP   11 months ago Chronic midline low back pain without sciatica   [facility], [place], PA-C   1 year ago Cough   Primary Care at [place], PA-C   1 year ago LRTI (lower respiratory tract infection)   Primary Care at [place], PA-C   2 years ago Encounter for initial preventive physical examination covered by [facility]   Primary Care at [place], PA-C             Passed - Completed PHQ-2 or PHQ-9 in the last 360 days.       QUEtiapine (SEROQUEL) 200 MG tablet 30 tablet 0    Sig: Take 1 tablet (200 mg total) by mouth at bedtime.     Not Delegated - Psychiatry:  Antipsychotics - Second Generation (Atypical) - quetiapine Failed - 11/03/2018  9:59 AM      Failed - This refill cannot be delegated      Failed - ALT in normal range and within 180 days    ALT  Date Value Ref Range Status  12/20/2015 12 6 - 29 U/L Final         Failed - AST in normal range and within 180 days    AST  Date Value Ref Range Status  12/20/2015 18 10 - 35 U/L Final         Failed - Last BP in normal range    BP Readings from Last 1 Encounters:  12/07/17 (!) 159/105  Passed - Valid encounter within last 6 months    Recent Outpatient Visits          4 months ago Essential hypertension   [facility], [place], NP   11 months ago Chronic midline low back pain without sciatica   Primary Care at [facility], [place], PA-C   1 year ago Cough   Primary Care at [place], PA-C   1 year ago LRTI (lower  respiratory tract infection)   Primary Care at [place], PA-C   2 years ago Encounter for initial preventive physical examination covered by [facility]   Primary Care at [place], PA-C             Passed - Completed PHQ-2 or PHQ-9 in the last 360 days.       tiotropium (SPIRIVA) 18 MCG inhalation capsule 30 capsule 6    Sig: Place 1 capsule (18 mcg total) into inhaler and inhale daily.     Pulmonology:  Anticholinergic Agents Passed - 11/03/2018  9:59 AM      Passed - Valid encounter within last 12 months    Recent Outpatient Visits          4 months ago Essential hypertension   Primary Care at [place], NP   11 months ago Chronic midline low back pain without sciatica   [facility], [place], PA-C   1 year ago Cough   Primary Care at [place], PA-C   1 year ago LRTI (lower respiratory tract infection)   Primary Care at [place], PA-C   2 years ago Encounter for initial preventive physical examination covered by [facility]   [facility], Audrie Lia, PA-C

## 2018-11-03 NOTE — Telephone Encounter (Signed)
Medication Refill - Medication:  tiZANidine (ZANAFLEX) 4 MG tablet    topiramate (TOPAMAX) 25 MG tablet   metoprolol tartrate (LOPRESSOR) 50 MG tablet    QUEtiapine (SEROQUEL) 200 MG tablet   sertraline (ZOLOFT) 100 MG tablet fluticasone-salmeterol (ADVAIR HFA) 115-21 MCG/ACT inhaler    gabapentin (NEURONTIN) 600 MG tablet      Preferred Pharmacy (with phone number or street name):  [place], [place] [phone] (Phone) [phone] (Fax)

## 2018-11-04 MED ORDER — GABAPENTIN 600 MG PO TABS: ORAL_TABLET | ORAL | 0 refills | Status: DC

## 2018-11-04 MED ORDER — QUETIAPINE FUMARATE 200 MG PO TABS: 200.0000 mg | ORAL_TABLET | Freq: Every day | ORAL | 0 refills | Status: DC

## 2018-11-04 MED ORDER — ADVAIR HFA 115-21 MCG/ACT IN AERO: 2.0000 | INHALATION_SPRAY | Freq: Two times a day (BID) | RESPIRATORY_TRACT | 1 refills | Status: DC

## 2018-11-04 MED ORDER — TOPIRAMATE 25 MG PO TABS: 25.0000 mg | ORAL_TABLET | Freq: Two times a day (BID) | ORAL | 0 refills | Status: DC

## 2018-11-04 MED ORDER — TIOTROPIUM BROMIDE MONOHYDRATE 18 MCG IN CAPS: 18.0000 ug | ORAL_CAPSULE | Freq: Every day | RESPIRATORY_TRACT | 1 refills | Status: DC

## 2018-11-04 MED ORDER — SERTRALINE HCL 100 MG PO TABS: 100.0000 mg | ORAL_TABLET | Freq: Every day | ORAL | 0 refills | Status: DC

## 2018-11-04 MED ORDER — TIZANIDINE HCL 4 MG PO TABS: ORAL_TABLET | ORAL | 0 refills | Status: DC

## 2018-11-04 NOTE — Telephone Encounter (Signed)
Requested Prescriptions   Pending Prescriptions Disp Refills  . tiZANidine (ZANAFLEX) 4 MG tablet 60 tablet 0    Sig: TAKE ONE TABLET BY MOUTH EVERY 6 HOURS AS NEEDED FOR MUSCLE SPASMS   Signed Prescriptions Disp Refills  . fluticasone-salmeterol (ADVAIR HFA) 115-21 MCG/ACT inhaler 12 Inhaler 1    Sig: Inhale 2 puffs into the lungs 2 (two) times daily.    Authorizing Provider: Maximiano Coss    Ordering User: Vanice Sarah topiramate (TOPAMAX) 25 MG tablet 60 tablet 0    Sig: Take 1 tablet (25 mg total) by mouth 2 (two) times daily.    Authorizing Provider: Maximiano Coss    Ordering User: Vanice Sarah  . gabapentin (NEURONTIN) 600 MG tablet 90 tablet 0    Sig: TAKE ONE TABLET BY MOUTH 3 TIMES A DAY    Authorizing Provider: Maximiano Coss    Ordering User: Nahdia Doucet C  . sertraline (ZOLOFT) 100 MG tablet 30 tablet 0    Sig: Take 1 tablet (100 mg total) by mouth daily.    Authorizing Provider: Maximiano Coss    Ordering User: Vanice Sarah QUEtiapine (SEROQUEL) 200 MG tablet 30 tablet 0    Sig: Take 1 tablet (200 mg total) by mouth at bedtime.    Authorizing Provider: Maximiano Coss    Ordering User: Vanice Sarah  . tiotropium (SPIRIVA) 18 MCG inhalation capsule 30 capsule 1    Sig: Place 1 capsule (18 mcg total) into inhaler and inhale daily.    Authorizing Provider: Maximiano Coss    Ordering User: Vanice Sarah    Last OV 12/02/2017  Last written 06/10/2018

## 2018-11-05 ENCOUNTER — Other Ambulatory Visit: Payer: Self-pay | Admitting: Registered Nurse

## 2018-11-05 DIAGNOSIS — I10 Essential (primary) hypertension: Secondary | ICD-10-CM

## 2018-11-05 MED ORDER — METOPROLOL TARTRATE 50 MG PO TABS: 50.0000 mg | ORAL_TABLET | Freq: Two times a day (BID) | ORAL | 0 refills | Status: DC

## 2018-11-05 NOTE — Telephone Encounter (Signed)
Medication: metoprolol tartrate (LOPRESSOR) 50 MG tablet    Patient is requesting a refill of this medication.    Pharmacy:  [place], [place] [phone] (Phone) [phone] (Fax)

## 2018-12-04 ENCOUNTER — Other Ambulatory Visit: Payer: Self-pay | Admitting: Registered Nurse

## 2018-12-04 DIAGNOSIS — F5104 Psychophysiologic insomnia: Secondary | ICD-10-CM

## 2018-12-04 DIAGNOSIS — M545 Low back pain, unspecified: Secondary | ICD-10-CM

## 2018-12-04 DIAGNOSIS — I10 Essential (primary) hypertension: Secondary | ICD-10-CM

## 2018-12-04 DIAGNOSIS — G43809 Other migraine, not intractable, without status migrainosus: Secondary | ICD-10-CM

## 2018-12-04 DIAGNOSIS — F331 Major depressive disorder, recurrent, moderate: Secondary | ICD-10-CM

## 2018-12-04 DIAGNOSIS — G2581 Restless legs syndrome: Secondary | ICD-10-CM

## 2018-12-04 DIAGNOSIS — G8929 Other chronic pain: Secondary | ICD-10-CM

## 2018-12-05 NOTE — Telephone Encounter (Signed)
Requested medication (s) are due for refill today:  yes  Requested medication (s) are on the active medication list:  yes  Future visit scheduled:  No  Last Refill:  Sertraline; 11/04/18 Tizanidine 11/04/18 Metoprolol 11/05/18 Topiramate 11/04/18 Gabapentin 11/04/18 Quetiapine 11/04/18  Note to clinic: Had Telemedicine visit 06/10/18, and was advised then, that needed labs to get more than 30 day refill on medications.    Requested Prescriptions  Pending Prescriptions Disp Refills   sertraline (ZOLOFT) 100 MG tablet [Pharmacy Med Name: Sertraline Hydrochloride 100mg  Tablet] 30 tablet 11    Sig: Take 1 tablet by mouth every day     Psychiatry:  Antidepressants - SSRI Passed - 12/04/2018  8:01 PM      Passed - Valid encounter within last 6 months    Recent Outpatient Visits          5 months ago Essential hypertension   [facility], [place], NP   1 year ago Chronic midline low back pain without sciatica   [facility], [place], PA-C   1 year ago Cough   Primary Care at [place], PA-C   2 years ago LRTI (lower respiratory tract infection)   [facility], Audrie Lia, PA-C   2 years ago Encounter for initial preventive physical examination covered by [facility]   Primary Care at [place], PA-C             Passed - Completed PHQ-2 or PHQ-9 in the last 360 days.       tiZANidine (ZANAFLEX) 4 MG tablet [Pharmacy Med Name: Tizanidine HCl 4mg  Tab] 60 tablet 11    Sig: Take 1 tablet by mouth every 6 hours as needed for muscle spasms     Not Delegated - Cardiovascular:  Alpha-2 Agonists - tizanidine Failed - 12/04/2018  8:01 PM      Failed - This refill cannot be delegated      Passed - Valid encounter within last 6 months    Recent Outpatient Visits          5 months ago Essential hypertension   [facility], Delfino Lovett, NP   1 year ago Chronic midline low back pain without sciatica    [facility], [place], PA-C   1 year ago Cough   Primary Care at [place], PA-C   2 years ago LRTI (lower respiratory tract infection)   [facility], Audrie Lia, PA-C   2 years ago Encounter for initial preventive physical examination covered by [facility]   [facility] at [facility], Audrie Lia, PA-C              metoprolol tartrate (LOPRESSOR) 50 MG tablet [Pharmacy Med Name: Metoprolol Tart 50mg  Tablet] 60 tablet 11    Sig: Take 1 tablet by mouth twice daily     Cardiovascular:  Beta Blockers Failed - 12/04/2018  8:01 PM      Failed - Last BP in normal range    BP Readings from Last 1 Encounters:  12/07/17 (!) 159/105         Passed - Last Heart Rate in normal range    Pulse Readings from Last 1 Encounters:  12/07/17 (!) 108         Passed - Valid encounter within last 6 months    Recent Outpatient Visits  5 months ago Essential hypertension   [facility], Delfino Lovett, NP   1 year ago Chronic midline low back pain without sciatica   Primary Care at [facility], [place], PA-C   1 year ago Cough   Primary Care at [place], PA-C   2 years ago LRTI (lower respiratory tract infection)   [facility] at [facility], Audrie Lia, PA-C   2 years ago Encounter for initial preventive physical examination covered by [facility]   Primary Care at [place], PA-C              topiramate (TOPAMAX) 25 MG tablet [Pharmacy Med Name: Topiramate 25mg  Tablet] 60 tablet 11    Sig: Take 1 tablet by mouth twice daily     Not Delegated - Neurology: Anticonvulsants - topiramate & zonisamide Failed - 12/04/2018  8:01 PM      Failed - This refill cannot be delegated      Failed - Cr in normal range and within 360 days    Creat  Date Value Ref Range Status  12/20/2015 0.70 0.50 - 1.10 mg/dL Final         Failed - CO2 in normal range and within 360 days    CO2  Date Value  Ref Range Status  12/20/2015 24 20 - 31 mmol/L Final         Passed - Valid encounter within last 12 months    Recent Outpatient Visits          5 months ago Essential hypertension   [facility], [place], NP   1 year ago Chronic midline low back pain without sciatica   [facility], [place], PA-C   1 year ago Cough   Primary Care at [place], PA-C   2 years ago LRTI (lower respiratory tract infection)   [facility], Audrie Lia, PA-C   2 years ago Encounter for initial preventive physical examination covered by [facility]   Primary Care at [place], PA-C              gabapentin (NEURONTIN) 600 MG tablet [Pharmacy Med Name: Gabapentin 600mg  Tablet] 90 tablet 11    Sig: Take 1 tablet by mouth 3 times a day     Neurology: Anticonvulsants - gabapentin Passed - 12/04/2018  8:01 PM      Passed - Valid encounter within last 12 months    Recent Outpatient Visits          5 months ago Essential hypertension   [facility] at [facility], [place], NP   1 year ago Chronic midline low back pain without sciatica   [facility] at [facility], [place], PA-C   1 year ago Cough   Primary Care at [place], PA-C   2 years ago LRTI (lower respiratory tract infection)   [facility] at [facility], Audrie Lia, PA-C   2 years ago Encounter for initial preventive physical examination covered by [facility]   Primary Care at [place], PA-C              QUEtiapine (SEROQUEL) 200 MG tablet [Pharmacy Med Name: Quetiapine Fumarate 200mg  Tablet] 30 tablet 11    Sig: Take 1 tablet by mouth at bedtime     Not Delegated - Psychiatry:  Antipsychotics - Second Generation (Atypical) - quetiapine Failed - 12/04/2018  8:01  PM      Failed - This refill cannot be delegated      Failed - ALT in normal range and within 180 days    ALT  Date Value Ref Range Status  12/20/2015 12 6 - 29  U/L Final         Failed - AST in normal range and within 180 days    AST  Date Value Ref Range Status  12/20/2015 18 10 - 35 U/L Final         Failed - Last BP in normal range    BP Readings from Last 1 Encounters:  12/07/17 (!) 159/105         Passed - Valid encounter within last 6 months    Recent Outpatient Visits          5 months ago Essential hypertension   [facility] at [facility], [place], NP   1 year ago Chronic midline low back pain without sciatica   [facility], [place], PA-C   1 year ago Cough   Primary Care at [place], PA-C   2 years ago LRTI (lower respiratory tract infection)   Primary Care at [place], PA-C   2 years ago Encounter for initial preventive physical examination covered by [facility]   Primary Care at [place], PA-C             Passed - Completed PHQ-2 or PHQ-9 in the last 360 days.

## 2018-12-06 ENCOUNTER — Other Ambulatory Visit: Payer: Self-pay | Admitting: Registered Nurse

## 2018-12-06 DIAGNOSIS — J441 Chronic obstructive pulmonary disease with (acute) exacerbation: Secondary | ICD-10-CM

## 2018-12-17 NOTE — Telephone Encounter (Signed)
[email or web address]

## 2018-12-23 NOTE — Telephone Encounter (Signed)
Pt is scheduled for 12/25/2018

## 2018-12-25 ENCOUNTER — Encounter: Payer: Self-pay | Admitting: Registered Nurse

## 2018-12-25 ENCOUNTER — Ambulatory Visit (INDEPENDENT_AMBULATORY_CARE_PROVIDER_SITE_OTHER): Payer: Medicare Other | Admitting: Registered Nurse

## 2018-12-25 ENCOUNTER — Ambulatory Visit: Payer: Medicare Other | Admitting: Registered Nurse

## 2018-12-25 ENCOUNTER — Other Ambulatory Visit: Payer: Self-pay

## 2018-12-25 DIAGNOSIS — Z13220 Encounter for screening for lipoid disorders: Secondary | ICD-10-CM

## 2018-12-25 DIAGNOSIS — I10 Essential (primary) hypertension: Secondary | ICD-10-CM | POA: Diagnosis not present

## 2018-12-25 DIAGNOSIS — G43809 Other migraine, not intractable, without status migrainosus: Secondary | ICD-10-CM

## 2018-12-25 DIAGNOSIS — Z131 Encounter for screening for diabetes mellitus: Secondary | ICD-10-CM

## 2018-12-25 DIAGNOSIS — M545 Low back pain: Secondary | ICD-10-CM

## 2018-12-25 DIAGNOSIS — G2581 Restless legs syndrome: Secondary | ICD-10-CM | POA: Diagnosis not present

## 2018-12-25 DIAGNOSIS — R7303 Prediabetes: Secondary | ICD-10-CM

## 2018-12-25 DIAGNOSIS — F5104 Psychophysiologic insomnia: Secondary | ICD-10-CM

## 2018-12-25 DIAGNOSIS — G8929 Other chronic pain: Secondary | ICD-10-CM

## 2018-12-25 LAB — HEMOGLOBIN A1C
Est. average glucose Bld gHb Est-mCnc: 105 mg/dL
Hgb A1c MFr Bld: 5.3 % (ref 4.8–5.6)

## 2018-12-25 LAB — LIPID PANEL
Chol/HDL Ratio: 1.8 ratio (ref 0.0–4.4)
Cholesterol, Total: 163 mg/dL (ref 100–199)
HDL: 93 mg/dL (ref >39)
LDL Chol Calc (NIH): 56 mg/dL (ref 0–99)
Triglycerides: 75 mg/dL (ref 0–149)
VLDL Cholesterol Cal: 14 mg/dL (ref 5–40)

## 2018-12-25 LAB — COMPREHENSIVE METABOLIC PANEL
ALT: 13 IU/L (ref 0–32)
AST: 19 IU/L (ref 0–40)
Albumin/Globulin Ratio: 1.6 (ref 1.2–2.2)
Albumin: 4.1 g/dL (ref 3.8–4.8)
Alkaline Phosphatase: 65 IU/L (ref 39–117)
BUN/Creatinine Ratio: 27 — ABNORMAL HIGH (ref 9–23)
BUN: 14 mg/dL (ref 6–24)
Bilirubin Total: <0.2 mg/dL (ref 0.0–1.2)
CO2: 21 mmol/L (ref 20–29)
Calcium: 9.5 mg/dL (ref 8.7–10.2)
Chloride: 103 mmol/L (ref 96–106)
Creatinine, Ser: 0.52 mg/dL — ABNORMAL LOW (ref 0.57–1.00)
GFR calc Af Amer: 131 mL/min/{1.73_m2} (ref >59)
GFR calc non Af Amer: 113 mL/min/{1.73_m2} (ref >59)
Globulin, Total: 2.5 g/dL (ref 1.5–4.5)
Glucose: 99 mg/dL (ref 65–99)
Potassium: 4.7 mmol/L (ref 3.5–5.2)
Sodium: 139 mmol/L (ref 134–144)
Total Protein: 6.6 g/dL (ref 6.0–8.5)

## 2018-12-25 NOTE — Patient Instructions (Signed)
° ° ° °  If you have lab work done today you will be contacted with your lab results within the next 2 weeks.  If you have not heard from us then please contact us. The fastest way to get your results is to register for My Chart. ° ° °IF you received an x-ray today, you will receive an invoice from [facility]. Please contact [facility] at [phone] with questions or concerns regarding your invoice.  ° °IF you received labwork today, you will receive an invoice from [facility]. Please contact [facility] at [phone] with questions or concerns regarding your invoice.  ° °Our billing staff will not be able to assist you with questions regarding bills from these companies. ° °You will be contacted with the lab results as soon as they are available. The fastest way to get your results is to activate your My Chart account. Instructions are located on the last page of this paperwork. If you have not heard from us regarding the results in 2 weeks, please contact this office. °  ° ° ° °

## 2018-12-25 NOTE — Progress Notes (Signed)
Established Patient Office Visit  Subjective:  Patient ID: Rita Hunter, female    DOB: 12-Sep-1970  Age: 48 y.o. MRN: [id number]  CC:  Chief Complaint  Patient presents with  . Chronic Conditions    6 month follow-up   . Medication Management    HPI Rita Hunter presents for medication management and labs.  Needs refills on a number of medications, and would like a referral to pain management. Unfortunately, the referral sent in April was to an office that she has been dismissed from - we will replace this referral today.   She will have labs drawn: CMP, A1c, Lipid Panel  She requests refills on her gabapentin, ropinirole, and tizanidine. We discussed that we are hoping for pain management to take over management of the gabapentin and tizanidine when she can establish with their office.   BP wnl today. Happy with the metoprolol and lisinopril. Wishes to continue  Happy with sertraline and topamax for depression and migraines, respectively   Does not need refills on COPD medications.   Past Medical History:  Diagnosis Date  . Allergy   . Anxiety   . Asthma   . COPD (chronic obstructive pulmonary disease) ([place])   . Depression   . Emphysema of lung ([place])   . High blood pressure   . Neuromuscular disorder [facility])     Past Surgical History:  Procedure Laterality Date  . HERNIA REPAIR  2017   3  . lapoma removal  Right   . Lumbar laminectony and fusion  2015   2    Family History  Problem Relation Age of Onset  . Diabetes Mother   . Heart disease Mother   . Mental illness Mother   . Hyperlipidemia Father   . Stroke Father   . Diabetes Sister   . Heart disease Sister   . Hyperlipidemia Sister   . Stroke Sister     Social History   Socioeconomic History  . Marital status: Divorced    Spouse name: Not on file  . Number of children: 1  . Years of education: Not on file  . Highest education level: Not on file  Occupational History  . Occupation: ssi  Social  Needs  . Financial resource strain: Not very hard  . Food insecurity    Worry: Never true    Inability: Never true  . Transportation needs    Medical: No    Non-medical: No  Tobacco Use  . Smoking status: Current Every Day Smoker    Packs/day: 0.50    Types: Cigarettes    Start date: 06/10/1998  . Smokeless tobacco: Never Used  Substance and Sexual Activity  . Alcohol use: No  . Drug use: No  . Sexual activity: Yes    Birth control/protection: None  Lifestyle  . Physical activity    Days per week: 0 days    Minutes per session: 0 min  . Stress: To some extent  Relationships  . Social Herbalist on phone: Three times a week    Gets together: Twice a week    Attends religious service: Never    Active member of club or organization: No    Attends meetings of clubs or organizations: Never    Relationship status: Divorced  . Intimate partner violence    Fear of current or ex partner: No    Emotionally abused: No    Physically abused: No    Forced sexual activity: No  Other  Topics Concern  . Not on file  Social History Narrative  . Not on file    Outpatient Medications Prior to Visit  Medication Sig Dispense Refill  . ADVAIR HFA 115-21 MCG/ACT inhaler Inhale 2 puffs by mouth twice daily 1 Inhaler 11  . albuterol (PROAIR HFA) 108 (90 Base) MCG/ACT inhaler Inhale 2 puffs by mouth every 4 hours as needed for wheezing or shortness of breath 8.5 each 14  . gabapentin (NEURONTIN) 600 MG tablet TAKE ONE TABLET BY MOUTH 3 TIMES A DAY 90 tablet 0  . lisinopril (ZESTRIL) 10 MG tablet Take 1 tablet by mouth every day 30 tablet 11  . metoprolol tartrate (LOPRESSOR) 50 MG tablet Take 1 tablet (50 mg total) by mouth 2 (two) times daily. 60 tablet 0  . QUEtiapine (SEROQUEL) 200 MG tablet Take 1 tablet (200 mg total) by mouth at bedtime. 30 tablet 0  . rOPINIRole (REQUIP) 2 MG tablet Take 1-2 (2-4mg ) tablets by mouth at bedtime 60 tablet 0  . sertraline (ZOLOFT) 100 MG tablet  Take 1 tablet (100 mg total) by mouth daily. 30 tablet 0  . SPIRIVA HANDIHALER 18 MCG inhalation capsule Inhale the contents of 1 capsule via handihaler every day 30 capsule 11  . tiZANidine (ZANAFLEX) 4 MG tablet TAKE ONE TABLET BY MOUTH EVERY 6 HOURS AS NEEDED FOR MUSCLE SPASMS 60 tablet 0  . topiramate (TOPAMAX) 25 MG tablet Take 1 tablet (25 mg total) by mouth 2 (two) times daily. 60 tablet 0  . varenicline (CHANTIX) 0.5 MG tablet Take 1 tablet (0.5 mg total) by mouth 2 (two) times daily. 60 tablet 0   No facility-administered medications prior to visit.     Allergies  Allergen Reactions  . Mobic [Meloxicam] Swelling    Swelling of tongue   . Ultram [Tramadol Hcl] Swelling    Swelling of tongue    ROS Review of Systems  per hpi   Objective:    Physical Exam  Constitutional: She is oriented to person, place, and time. She appears well-developed and well-nourished. No distress.  Cardiovascular: Normal rate and regular rhythm.  Pulmonary/Chest: [place] normal. No respiratory distress.  Neurological: She is alert and oriented to person, place, and time.  Skin: Skin is warm and dry. No rash noted. She is not diaphoretic. No erythema. No pallor.  Psychiatric: She has a normal mood and affect. Her behavior is normal. Judgment and thought content normal.  Nursing note and vitals reviewed.   BP 140/82   Pulse 78   Temp 98 F (36.7 C) (Oral)   Resp 16   Ht 5\' 7"  (1.702 m)   Wt 141 lb (64 kg)   SpO2 95%   BMI 22.08 kg/m  Wt Readings from Last 3 Encounters:  12/25/18 141 lb (64 kg)  12/07/17 120 lb 12.8 oz (54.8 kg)  07/28/17 127 lb (57.6 kg)     Health Maintenance Due  Topic Date Due  . PAP SMEAR-Modifier  04/12/2018    There are no preventive care reminders to display for this patient.  Lab Results  Component Value Date   TSH 1.22 12/20/2015   Lab Results  Component Value Date   WBC 9.9 12/20/2015   HGB 13.9 12/20/2015   HCT 41.9 12/20/2015   MCV 95.0  12/20/2015   PLT 361 12/20/2015   Lab Results  Component Value Date   NA 138 12/20/2015   K 4.3 12/20/2015   CO2 24 12/20/2015   GLUCOSE 102 (H) 12/20/2015  BUN 11 12/20/2015   CREATININE 0.70 12/20/2015   BILITOT 0.3 12/20/2015   ALKPHOS 48 12/20/2015   AST 18 12/20/2015   ALT 12 12/20/2015   PROT 6.9 12/20/2015   ALBUMIN 4.3 12/20/2015   CALCIUM 9.5 12/20/2015   Lab Results  Component Value Date   CHOL 158 12/20/2015   Lab Results  Component Value Date   HDL 85 12/20/2015   Lab Results  Component Value Date   LDLCALC 51 12/20/2015   Lab Results  Component Value Date   TRIG 112 12/20/2015   Lab Results  Component Value Date   CHOLHDL 1.9 12/20/2015   Lab Results  Component Value Date   HGBA1C 5.7 (H) 08/26/2016      Assessment & Plan:   Problem List Items Addressed This Visit      Cardiovascular and Mediastinum   Essential hypertension   Relevant Medications   metoprolol tartrate (LOPRESSOR) 50 MG tablet   lisinopril (ZESTRIL) 10 MG tablet   Migraine   Relevant Medications   gabapentin (NEURONTIN) 600 MG tablet   metoprolol tartrate (LOPRESSOR) 50 MG tablet   lisinopril (ZESTRIL) 10 MG tablet   tiZANidine (ZANAFLEX) 4 MG tablet   topiramate (TOPAMAX) 25 MG tablet    Other Visit Diagnoses    Screening for cholesterol level       Screening for diabetes mellitus       Prediabetes       RLS (restless legs syndrome)       Relevant Medications   gabapentin (NEURONTIN) 600 MG tablet   Chronic insomnia       Relevant Medications   rOPINIRole (REQUIP) 2 MG tablet   QUEtiapine (SEROQUEL) 200 MG tablet   Chronic midline low back pain without sciatica       Relevant Medications   tiZANidine (ZANAFLEX) 4 MG tablet   Other Relevant Orders   Ambulatory referral to Pain Clinic      No orders of the defined types were placed in this encounter.   Follow-up: No follow-ups on file.   PLAN  Pt appearing well overall today  No new concerns  Labs  drawn, will follow up as warranted  Patient encouraged to call clinic with any questions, comments, or concerns.   Maximiano Coss, NP

## 2018-12-28 ENCOUNTER — Encounter: Payer: Self-pay | Admitting: Radiology

## 2018-12-28 ENCOUNTER — Encounter: Payer: Self-pay | Admitting: Registered Nurse

## 2018-12-28 ENCOUNTER — Telehealth: Payer: Self-pay | Admitting: Registered Nurse

## 2018-12-28 NOTE — Telephone Encounter (Signed)
Pt was seen on 01/04/19. She now needs all these scripts filled please.

## 2018-12-28 NOTE — Telephone Encounter (Signed)
Check medf refill request  from 12/04/18.Marland Kitchen

## 2018-12-28 NOTE — Progress Notes (Signed)
Good morning,  Normal results letter to Ms. Coppess, please!  Thank you so much,  Kathrin Ruddy, NP

## 2018-12-29 NOTE — Telephone Encounter (Signed)
Pharmacy calling to check status of this request.  °

## 2018-12-30 ENCOUNTER — Other Ambulatory Visit: Payer: Self-pay | Admitting: Physician Assistant

## 2018-12-30 DIAGNOSIS — J441 Chronic obstructive pulmonary disease with (acute) exacerbation: Secondary | ICD-10-CM

## 2018-12-30 NOTE — Telephone Encounter (Signed)
Requested medication (s) are due for refill today: no  Requested medication (s) are on the active medication list: yes  Last refill:  12/04/2018  Future visit scheduled: no  Notes to clinic:  Review for refill One inhaler should last one month    Requested Prescriptions  Pending Prescriptions Disp Refills   albuterol (VENTOLIN HFA) 108 (90 Base) MCG/ACT inhaler [Pharmacy Med Name: PROAIR HFA 82mcg/actuation Inhalation Aerosol]  11    Sig: Inhale 2 puffs by mouth every 4 hours as needed for wheezing or shortness of breath     Pulmonology:  Beta Agonists Failed - 12/30/2018  7:46 AM      Failed - One inhaler should last at least one month. If the patient is requesting refills earlier, contact the patient to check for uncontrolled symptoms.      Passed - Valid encounter within last 12 months    Recent Outpatient Visits          5 days ago Essential hypertension   [facility], Delfino Lovett, NP   6 months ago Essential hypertension   [facility], [place], NP   1 year ago Chronic midline low back pain without sciatica   [facility], [place] D, PA-C   1 year ago Cough   [facility] at [place], PA-C   2 years ago LRTI (lower respiratory tract infection)   [facility] at [facility], Audrie Lia, PA-C

## 2018-12-31 ENCOUNTER — Other Ambulatory Visit: Payer: Self-pay | Admitting: Registered Nurse

## 2018-12-31 DIAGNOSIS — F5104 Psychophysiologic insomnia: Secondary | ICD-10-CM

## 2018-12-31 MED ORDER — GABAPENTIN 600 MG PO TABS: ORAL_TABLET | ORAL | 11 refills | Status: DC

## 2018-12-31 MED ORDER — QUETIAPINE FUMARATE 200 MG PO TABS: 200.0000 mg | ORAL_TABLET | Freq: Every day | ORAL | 11 refills | Status: DC

## 2018-12-31 MED ORDER — ROPINIROLE HCL 2 MG PO TABS: ORAL_TABLET | ORAL | 0 refills | Status: DC

## 2018-12-31 MED ORDER — TIZANIDINE HCL 4 MG PO TABS: ORAL_TABLET | ORAL | 11 refills | Status: DC

## 2018-12-31 MED ORDER — TOPIRAMATE 25 MG PO TABS: 25.0000 mg | ORAL_TABLET | Freq: Two times a day (BID) | ORAL | 11 refills | Status: DC

## 2018-12-31 MED ORDER — LISINOPRIL 10 MG PO TABS: 10.0000 mg | ORAL_TABLET | Freq: Every day | ORAL | 11 refills | Status: DC

## 2018-12-31 MED ORDER — METOPROLOL TARTRATE 50 MG PO TABS: 50.0000 mg | ORAL_TABLET | Freq: Two times a day (BID) | ORAL | 11 refills | Status: DC

## 2018-12-31 NOTE — Telephone Encounter (Signed)
Rx has been sent to pharmacy by provider on 12/30/2018

## 2018-12-31 NOTE — Telephone Encounter (Signed)
Pt requesting a 90 day supply please of Requip 2 mg.

## 2019-01-28 ENCOUNTER — Other Ambulatory Visit: Payer: Self-pay | Admitting: Registered Nurse

## 2019-01-28 DIAGNOSIS — F5104 Psychophysiologic insomnia: Secondary | ICD-10-CM

## 2019-01-28 NOTE — Telephone Encounter (Signed)
Requested medication (s) are due for refill today: yes  Requested medication (s) are on the active medication list: yes  Last refill:  01/28/2019  Future visit scheduled: no  Notes to clinic:  Patient would like a 90 day supply   Requested Prescriptions  Pending Prescriptions Disp Refills   rOPINIRole (REQUIP) 2 MG tablet [Pharmacy Med Name: ROPINIROLE 2MG  TABLETS] 180 tablet     Sig: TAKE 1 TO 2 TABLETS(2 TO 4 MG) BY MOUTH AT BEDTIME      Neurology:  Parkinsonian Agents Failed - 01/28/2019 11:06 AM      Failed - Last BP in normal range    BP Readings from Last 1 Encounters:  12/25/18 140/82          Passed - Valid encounter within last 12 months    Recent Outpatient Visits           1 month ago Essential hypertension   [facility], Delfino Lovett, NP   7 months ago Essential hypertension   [facility], [place], NP   1 year ago Chronic midline low back pain without sciatica   [facility], [place] D, PA-C   1 year ago Cough   Primary [facility] at [place], PA-C   2 years ago LRTI (lower respiratory tract infection)   [facility] at [place], PA-C

## 2019-03-26 ENCOUNTER — Other Ambulatory Visit: Payer: Self-pay

## 2019-03-26 NOTE — Patient Outreach (Signed)
[facility]) Care Management  03/26/2019  Rita Hunter 03-Mar-1970 [id number]   Medication Adherence call to Mrs. Rita Hunter Hipp[facility] Identifiers Verify spoke with patient she is past due on Lisinopril 10 mg,patient explain she is now taking 1 tablet 2 times daily,not 1 tab tablet daily,patient has medication at this time. Rita Hunter is showing past due under [place].   [facility] Management Direct Dial [phone]  Fax [phone] [email or web address]

## 2019-04-07 IMAGING — DX DG CHEST 2V
2 series · 2 of 2 positions shown · non-contrast
Comparison: Radiographs November 27, 2015.

CLINICAL DATA: Cough.

EXAM:
CHEST - 2 VIEW

[chest pa]
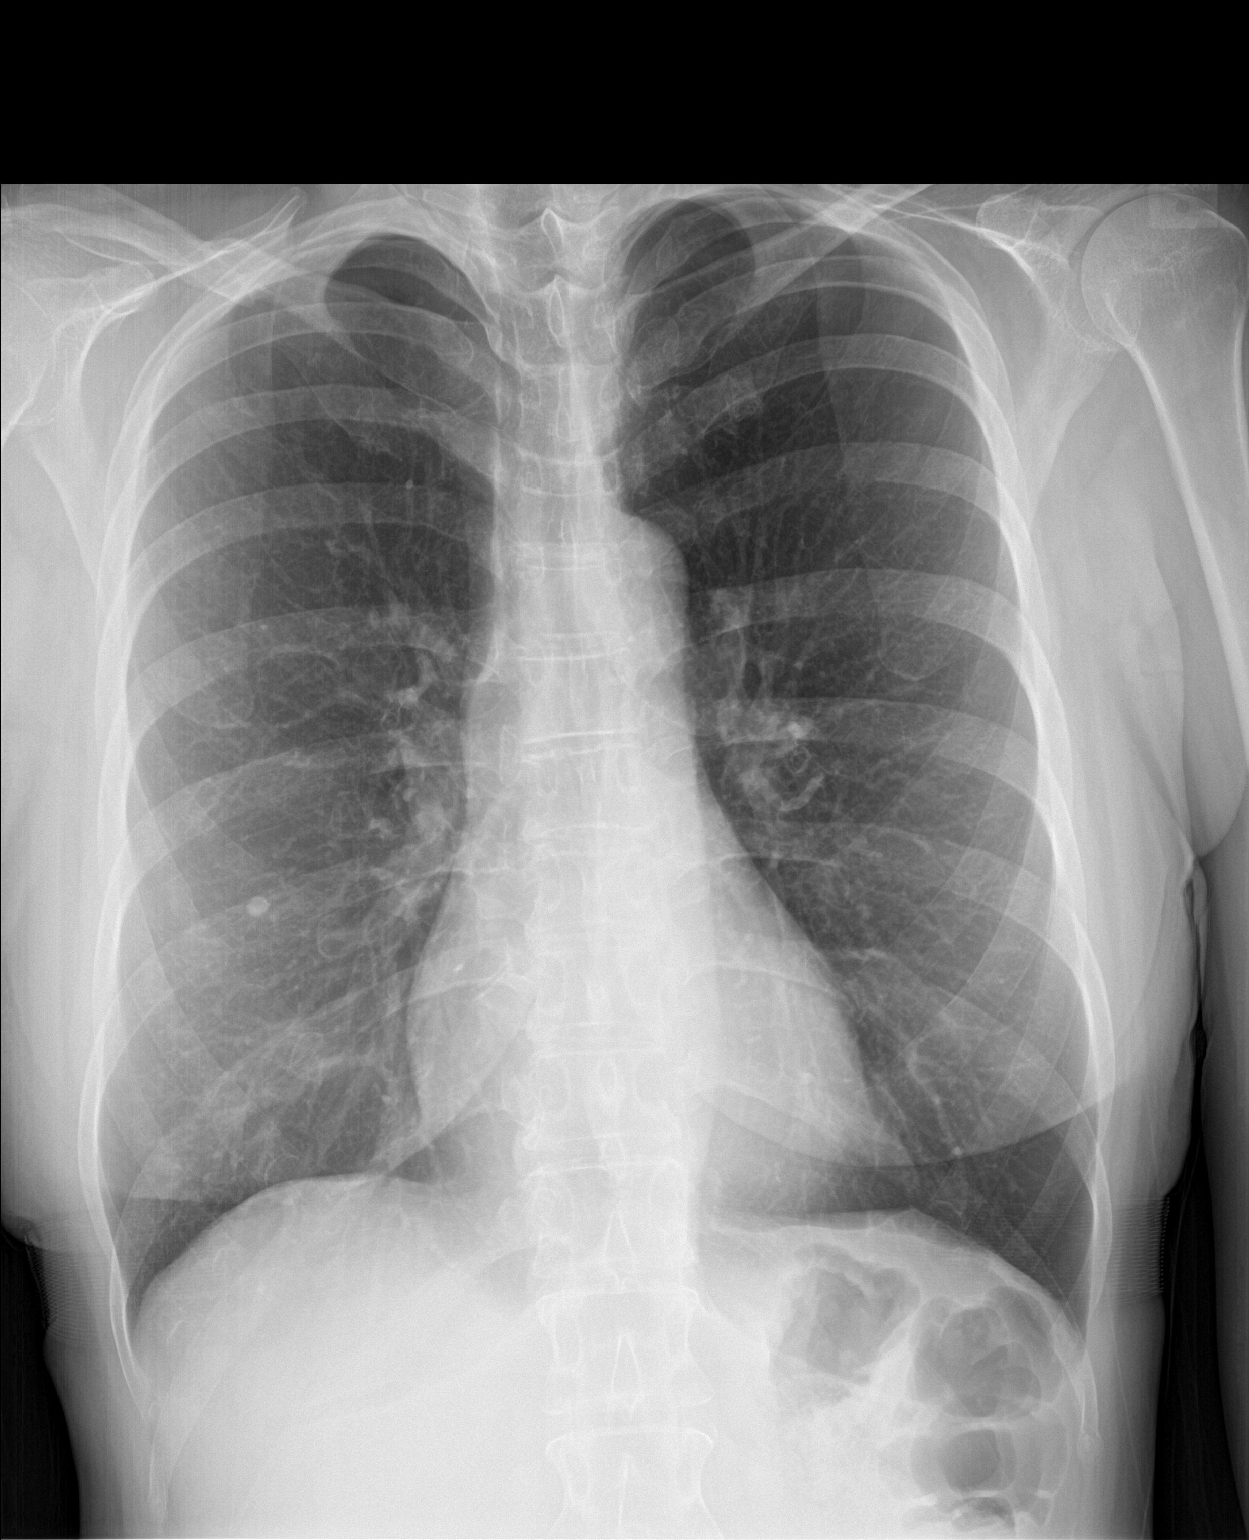

[chest lat]
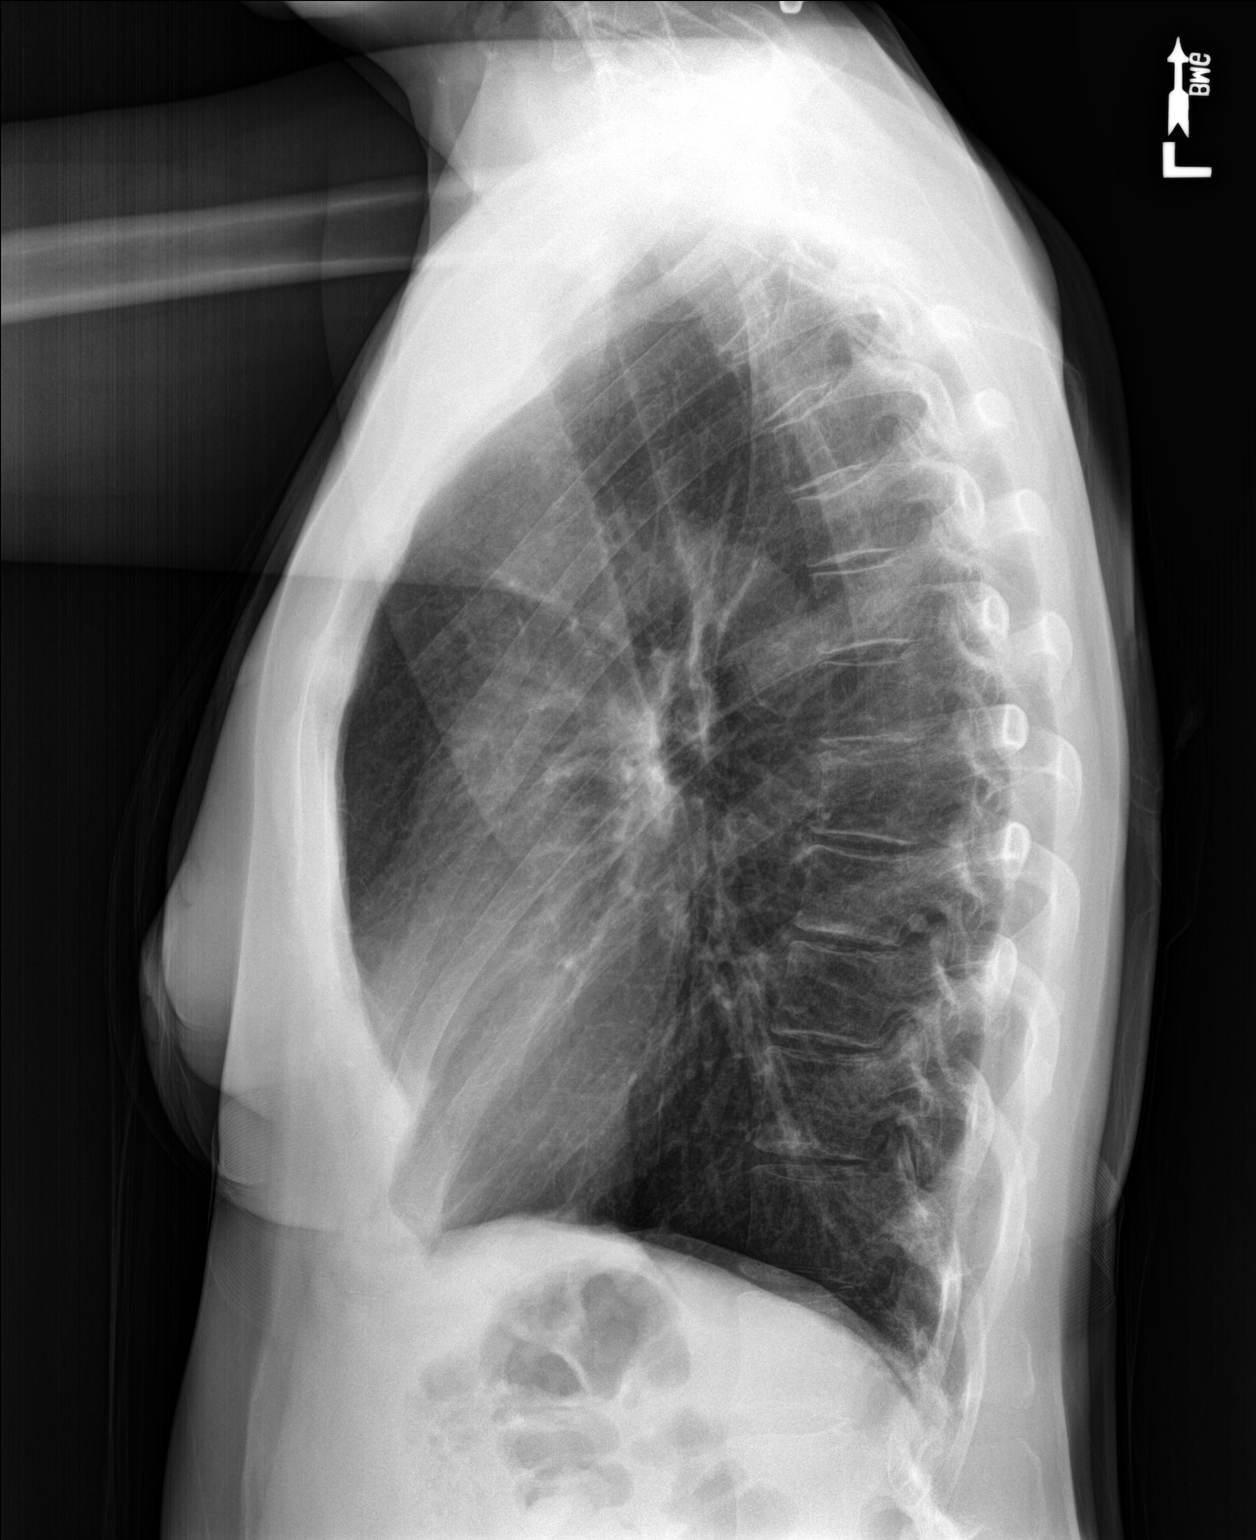

[2 of 2 positions shown; findings below may reference images not displayed]

FINDINGS: The heart size and mediastinal contours are within normal limits. No
pneumothorax or pleural effusion is noted. Stable small calcified
granuloma seen in right lower lobe. No acute pulmonary abnormality
is noted. The visualized skeletal structures are unremarkable.
IMPRESSION: No active cardiopulmonary disease.

## 2019-05-27 ENCOUNTER — Telehealth: Payer: Self-pay | Admitting: *Deleted

## 2019-05-27 NOTE — Telephone Encounter (Signed)
SCHEDULE AWV 

## 2019-08-27 ENCOUNTER — Other Ambulatory Visit: Payer: Self-pay | Admitting: Registered Nurse

## 2019-08-27 DIAGNOSIS — I10 Essential (primary) hypertension: Secondary | ICD-10-CM

## 2019-08-27 NOTE — Telephone Encounter (Signed)
No further refills without office visit 

## 2019-08-27 NOTE — Telephone Encounter (Signed)
08/27/2019- PATIENT REQUESTING A REFILL ON HER LISINOPRIL 10 mg. SHE WAS GIVEN A 30 DAY COURTESY SUPPLY. HOWEVER, IT WILL NOT BE REFILLED AGAIN UNTIL SHE SCHEDULES AN OFFICE VISIT WITH [place]. I TRIED TO CALL AND SCHEDULE, BUT HAD TO LEAVE A MESSAGE ON HER VOICE MAIL TO RETURN MY CALL. [place]

## 2019-08-27 NOTE — Telephone Encounter (Signed)
Please schedule a f/u appt for med refills 30 day supply has been sent into the pharmacay

## 2019-10-08 ENCOUNTER — Other Ambulatory Visit: Payer: Self-pay | Admitting: Registered Nurse

## 2019-10-08 DIAGNOSIS — J441 Chronic obstructive pulmonary disease with (acute) exacerbation: Secondary | ICD-10-CM

## 2019-10-25 ENCOUNTER — Other Ambulatory Visit: Payer: Self-pay | Admitting: Registered Nurse

## 2019-10-25 DIAGNOSIS — I10 Essential (primary) hypertension: Secondary | ICD-10-CM

## 2019-10-25 NOTE — Telephone Encounter (Signed)
A curtesy refill has been sent in for pt's lisinopril

## 2019-11-24 ENCOUNTER — Other Ambulatory Visit: Payer: Self-pay | Admitting: Registered Nurse

## 2019-11-24 DIAGNOSIS — I10 Essential (primary) hypertension: Secondary | ICD-10-CM

## 2019-11-24 NOTE — Telephone Encounter (Signed)
Called pt the phone is not in services

## 2019-11-24 NOTE — Telephone Encounter (Signed)
Please schedule patient an appt f/u and med refills. 30 day has been sent to patient pharmacy

## 2019-11-24 NOTE — Telephone Encounter (Signed)
No further refills without office visit 

## 2019-12-23 ENCOUNTER — Other Ambulatory Visit: Payer: Self-pay | Admitting: Registered Nurse

## 2019-12-23 DIAGNOSIS — F331 Major depressive disorder, recurrent, moderate: Secondary | ICD-10-CM

## 2019-12-23 DIAGNOSIS — I10 Essential (primary) hypertension: Secondary | ICD-10-CM

## 2019-12-23 DIAGNOSIS — G8929 Other chronic pain: Secondary | ICD-10-CM

## 2019-12-23 DIAGNOSIS — M545 Low back pain, unspecified: Secondary | ICD-10-CM

## 2019-12-23 DIAGNOSIS — F5104 Psychophysiologic insomnia: Secondary | ICD-10-CM

## 2019-12-23 NOTE — Telephone Encounter (Signed)
Requested medication (s) are due for refill today: Yes  Requested medication (s) are on the active medication list: Yes  Last refill:  12/31/18  Future visit scheduled: No  Notes to clinic:  Unable to refill per protocol, cannot delegate, appointment needed, unable to reach patient via phone, courtesy refill given for other requests     Requested Prescriptions  Pending Prescriptions Disp Refills   QUEtiapine (SEROQUEL) 200 MG tablet [Pharmacy Med Name: Quetiapine Fumarate 200mg  Tablet] 30 tablet 11    Sig: Take 1 tablet by mouth at bedtime      Not Delegated - Psychiatry:  Antipsychotics - Second Generation (Atypical) - quetiapine Failed - 12/23/2019 11:48 AM      Failed - This refill cannot be delegated      Failed - ALT in normal range and within 180 days    ALT  Date Value Ref Range Status  12/25/2018 13 0 - 32 IU/L Final          Failed - AST in normal range and within 180 days    AST  Date Value Ref Range Status  12/25/2018 19 0 - 40 IU/L Final          Failed - Completed PHQ-2 or PHQ-9 in the last 360 days      Failed - Last BP in normal range    BP Readings from Last 1 Encounters:  12/25/18 140/82          Failed - Valid encounter within last 6 months    Recent Outpatient Visits           12 months ago Essential hypertension   [facility], [place], NP   1 year ago Essential hypertension   [facility], [place], NP   2 years ago Chronic midline low back pain without sciatica   [facility], [place] D, PA-C   2 years ago Cough   [facility], Audrie Lia, PA-C   3 years ago LRTI (lower respiratory tract infection)   [facility], Audrie Lia, PA-C               Signed Prescriptions Disp Refills   metoprolol tartrate (LOPRESSOR) 50 MG tablet 60 tablet 0    Sig: Take 1 tablet (50 mg total) by mouth 2 (two) times daily. APPOINTMENT NEEDED FOR ADDITIONAL REFILLS       Cardiovascular:  Beta Blockers Failed - 12/23/2019 11:48 AM      Failed - Last BP in normal range    BP Readings from Last 1 Encounters:  12/25/18 140/82          Failed - Valid encounter within last 6 months    Recent Outpatient Visits           12 months ago Essential hypertension   [facility], Delfino Lovett, NP   1 year ago Essential hypertension   [facility], [place], NP   2 years ago Chronic midline low back pain without sciatica   [facility], [place] D, PA-C   2 years ago Cough   [facility] at [place], PA-C   3 years ago LRTI (lower respiratory tract infection)   [facility] at [facility], Audrie Lia, PA-C              Passed - Last Heart Rate in normal range    Pulse Readings  from Last 1 Encounters:  12/25/18 78            sertraline (ZOLOFT) 100 MG tablet 30 tablet 0    Sig: Take 1 tablet (100 mg total) by mouth daily. APPOINTMENT NEEDED FOR ADDITIONAL REFILLS      Psychiatry:  Antidepressants - SSRI Failed - 12/23/2019 11:48 AM      Failed - Completed PHQ-2 or PHQ-9 in the last 360 days      Failed - Valid encounter within last 6 months    Recent Outpatient Visits           12 months ago Essential hypertension   [facility], Delfino Lovett, NP   1 year ago Essential hypertension   [facility], [place], NP   2 years ago Chronic midline low back pain without sciatica   [facility], [place] D, PA-C   2 years ago Cough   Primary Care at [name], PA-C   3 years ago LRTI (lower respiratory tract infection)   [facility] at [place], PA-C

## 2019-12-23 NOTE — Telephone Encounter (Addendum)
Patient called, recording number is no longer in service. Last OV: 12/24/18, will need OV for additional refills, 30 day courtesy refill sent.

## 2019-12-23 NOTE — Telephone Encounter (Signed)
Requested medication (s) are due for refill today:no  Requested medication (s) are on the active medication list: yes   Last refill:  12/02/2019  Future visit scheduled: no   Notes to clinic:  this refill cannot be delegated    Requested Prescriptions  Pending Prescriptions Disp Refills   tiZANidine (ZANAFLEX) 4 MG tablet [Pharmacy Med Name: Tizanidine HCl 4mg  Tab] 60 tablet 11    Sig: Take 1 tablet by mouth every 6 hours as needed for muscle spasms      Not Delegated - Cardiovascular:  Alpha-2 Agonists - tizanidine Failed - 12/23/2019 11:34 AM      Failed - This refill cannot be delegated      Failed - Valid encounter within last 6 months    Recent Outpatient Visits           12 months ago Essential hypertension   [facility], Delfino Lovett, NP   1 year ago Essential hypertension   [facility], [place], NP   2 years ago Chronic midline low back pain without sciatica   [facility], [place], PA-C   2 years ago Cough   Primary Care at [place], PA-C   3 years ago LRTI (lower respiratory tract infection)   [facility] at [facility], Audrie Lia, PA-C

## 2019-12-24 ENCOUNTER — Other Ambulatory Visit: Payer: Self-pay | Admitting: Registered Nurse

## 2019-12-24 DIAGNOSIS — I10 Essential (primary) hypertension: Secondary | ICD-10-CM

## 2019-12-24 NOTE — Telephone Encounter (Signed)
We will refill meds for another month or so however since pt has not been seen since 12/2018 we would like her to make an appointment with [place] to get an appointment on the books.   Please assist.

## 2019-12-24 NOTE — Telephone Encounter (Signed)
Requested medications are due for refill today yes  Requested medications are on the active medication list yes  Last refill 02/12/1999??  Last visit 12 months ago  Future visit scheduled no  Notes to clinic failed protocol of valid visit within 6 months. Already had a curtesy refill and did not fill it, no upcoming visit scheduled.

## 2019-12-27 ENCOUNTER — Other Ambulatory Visit: Payer: Self-pay | Admitting: Registered Nurse

## 2019-12-27 DIAGNOSIS — I10 Essential (primary) hypertension: Secondary | ICD-10-CM

## 2019-12-27 DIAGNOSIS — F331 Major depressive disorder, recurrent, moderate: Secondary | ICD-10-CM

## 2019-12-27 NOTE — Telephone Encounter (Signed)
12/27/2019 - PATIENT REQUESTING A REFILL ON HER LISINOPRIL 10 mg. I HAVE MADE HER AN OFFICE VISIT APPOINTMENT TO SEE RICH MORROW ON Friday (12/31/2019) AT 11:30 am. SHE WAS GIVEN A 30 DAY SUPPLY. I DO NOT HAVE TO ROUTE BACK TO THE CLINICAL TEAM AT THIS TIME. [place]

## 2019-12-30 ENCOUNTER — Telehealth: Payer: Self-pay | Admitting: Registered Nurse

## 2019-12-30 NOTE — Telephone Encounter (Addendum)
12/30/2019 - PHARMACY CALLING TO SAY THAT SOME OF THIS PATIENT'S MEDICATIONS WERE NOT SENT IN. SHE NEEDS GABAPENTIN 600 MG, QUETIAPINE 200 MG, TOPIRAMATE 25 MG AND TIZANDINE 4 MG.  HER PROVIDER IS [place]. IF QUESTIONS PLEASE CALL [phone]  [place]

## 2019-12-31 ENCOUNTER — Ambulatory Visit: Payer: Medicare Other | Admitting: Registered Nurse

## 2019-12-31 NOTE — Telephone Encounter (Signed)
RX concerns will be address at the [place] visit today.

## 2019-12-31 NOTE — Telephone Encounter (Signed)
Please see if pt wants to make another appointment for before we can send her any medications refills. Since she has not been seen in over a year.

## 2019-12-31 NOTE — Telephone Encounter (Signed)
Called pt and schedule another med refill appt for 11/24.

## 2019-12-31 NOTE — Telephone Encounter (Signed)
Pt is scheduled for today for appt

## 2019-12-31 NOTE — Telephone Encounter (Signed)
Pls call pt to schedule appt for medication refill

## 2019-12-31 NOTE — Telephone Encounter (Signed)
Pt a no show for appt.  Kathrin Ruddy, NP

## 2020-01-03 ENCOUNTER — Encounter: Payer: Self-pay | Admitting: Registered Nurse

## 2020-01-03 ENCOUNTER — Other Ambulatory Visit: Payer: Self-pay | Admitting: Registered Nurse

## 2020-01-03 DIAGNOSIS — G43809 Other migraine, not intractable, without status migrainosus: Secondary | ICD-10-CM

## 2020-01-03 DIAGNOSIS — F5104 Psychophysiologic insomnia: Secondary | ICD-10-CM

## 2020-01-03 DIAGNOSIS — G8929 Other chronic pain: Secondary | ICD-10-CM

## 2020-01-03 DIAGNOSIS — G2581 Restless legs syndrome: Secondary | ICD-10-CM

## 2020-01-03 MED ORDER — GABAPENTIN 600 MG PO TABS: ORAL_TABLET | ORAL | 0 refills | Status: DC

## 2020-01-03 NOTE — Telephone Encounter (Signed)
Medication Refill - Medication: gabapentin (NEURONTIN) 600 MG tablet ,QUEtiapine (SEROQUEL) 200 MG tablet,topiramate (TOPAMAX) 25 MG tablet,tiZANidine (ZANAFLEX) 4 MG tablet   Has the patient contacted their pharmacy?yes (Agent: If no, request that the patient contact the pharmacy for the refill.) (Agent: If yes, when and what did the pharmacy advise?)Contact PCP  Preferred Pharmacy (with phone number or street name):  [facility] Phone:  [phone]  Fax:  [phone]       Agent: Please be advised that RX refills may take up to 3 business days. We ask that you follow-up with your pharmacy.

## 2020-01-03 NOTE — Telephone Encounter (Signed)
Requested medication (s) are due for refill today: yes  Requested medication (s) are on the active medication list: yes  Last refill:  12/31/18 with 11 refills  Future visit scheduled: yes, 01/05/20  Notes to clinic:  Please review for refill. Refill not delegated per protocol.    Requested Prescriptions  Pending Prescriptions Disp Refills   QUEtiapine (SEROQUEL) 200 MG tablet 30 tablet 11    Sig: Take 1 tablet (200 mg total) by mouth at bedtime.      Not Delegated - Psychiatry:  Antipsychotics - Second Generation (Atypical) - quetiapine Failed - 01/03/2020 12:05 PM      Failed - This refill cannot be delegated      Failed - ALT in normal range and within 180 days    ALT  Date Value Ref Range Status  12/25/2018 13 0 - 32 IU/L Final          Failed - AST in normal range and within 180 days    AST  Date Value Ref Range Status  12/25/2018 19 0 - 40 IU/L Final          Failed - Completed PHQ-2 or PHQ-9 in the last 360 days      Failed - Last BP in normal range    BP Readings from Last 1 Encounters:  12/25/18 140/82          Failed - Valid encounter within last 6 months    Recent Outpatient Visits           1 year ago Essential hypertension   [facility], Delfino Lovett, NP   1 year ago Essential hypertension   [facility], [place], NP   2 years ago Chronic midline low back pain without sciatica   [facility] [place], [place] D, PA-C   2 years ago Cough   [facility], Audrie Lia, PA-C   3 years ago LRTI (lower respiratory tract infection)   [facility], Audrie Lia, PA-C       Future Appointments             In 2 days Maximiano Coss, NP [facility] [place], [facility]              topiramate (TOPAMAX) 25 MG tablet 60 tablet 11    Sig: Take 1 tablet (25 mg total) by mouth 2 (two) times daily.      Not Delegated - Neurology: Anticonvulsants - topiramate & zonisamide Failed -  01/03/2020 12:05 PM      Failed - This refill cannot be delegated      Failed - Cr in normal range and within 360 days    Creat  Date Value Ref Range Status  12/20/2015 0.70 0.50 - 1.10 mg/dL Final   Creatinine, Ser  Date Value Ref Range Status  12/25/2018 0.52 (L) 0.57 - 1.00 mg/dL Final          Failed - CO2 in normal range and within 360 days    CO2  Date Value Ref Range Status  12/25/2018 21 20 - 29 mmol/L Final          Failed - Valid encounter within last 12 months    Recent Outpatient Visits           1 year ago Essential hypertension   [facility], Delfino Lovett, NP   1 year ago Essential hypertension   [facility] at [facility]  Maximiano Coss, NP   2 years ago Chronic midline low back pain without sciatica   [facility] at [facility], [place] D, PA-C   2 years ago Cough   [facility] at [place], PA-C   3 years ago LRTI (lower respiratory tract infection)   [facility] at [place], PA-C       Future Appointments             In 2 days Maximiano Coss, NP [facility] at [facility], [place]              tiZANidine (ZANAFLEX) 4 MG tablet 60 tablet 11    Sig: Take 1 tablet by mouth every 6 hours as needed for muscle spasms      Not Delegated - Cardiovascular:  Alpha-2 Agonists - tizanidine Failed - 01/03/2020 12:05 PM      Failed - This refill cannot be delegated      Failed - Valid encounter within last 6 months    Recent Outpatient Visits           1 year ago Essential hypertension   [facility], Delfino Lovett, NP   1 year ago Essential hypertension   [facility], [place], NP   2 years ago Chronic midline low back pain without sciatica   [facility], [place] D, PA-C   2 years ago Cough   [facility] at [place], PA-C   3 years ago LRTI (lower respiratory tract infection)   [facility], Audrie Lia, PA-C       Future  Appointments             In 2 days Maximiano Coss, NP [facility] at [place], [facility]             Signed Prescriptions Disp Refills   gabapentin (NEURONTIN) 600 MG tablet 90 tablet 0    Sig: Take 1 tablet by mouth 3 times a day      Neurology: Anticonvulsants - gabapentin Failed - 01/03/2020 12:05 PM      Failed - Valid encounter within last 12 months    Recent Outpatient Visits           1 year ago Essential hypertension   [facility], Delfino Lovett, NP   1 year ago Essential hypertension   [facility], [place], NP   2 years ago Chronic midline low back pain without sciatica   [facility], [place] D, PA-C   2 years ago Cough   [facility] at [place], PA-C   3 years ago LRTI (lower respiratory tract infection)   [facility], Audrie Lia, PA-C       Future Appointments             In 2 days Maximiano Coss, NP [facility] [place], [facility]

## 2020-01-04 MED ORDER — QUETIAPINE FUMARATE 200 MG PO TABS: 200.0000 mg | ORAL_TABLET | Freq: Every day | ORAL | 11 refills | Status: DC

## 2020-01-04 MED ORDER — TIZANIDINE HCL 4 MG PO TABS: ORAL_TABLET | ORAL | 11 refills | Status: DC

## 2020-01-04 MED ORDER — TOPIRAMATE 25 MG PO TABS: 25.0000 mg | ORAL_TABLET | Freq: Two times a day (BID) | ORAL | 11 refills | Status: AC

## 2020-01-05 ENCOUNTER — Ambulatory Visit: Payer: Medicare Other | Admitting: Registered Nurse

## 2020-01-10 ENCOUNTER — Encounter: Payer: Self-pay | Admitting: Registered Nurse

## 2020-01-19 ENCOUNTER — Other Ambulatory Visit: Payer: Self-pay | Admitting: Registered Nurse

## 2020-01-19 DIAGNOSIS — F331 Major depressive disorder, recurrent, moderate: Secondary | ICD-10-CM

## 2020-01-19 DIAGNOSIS — I10 Essential (primary) hypertension: Secondary | ICD-10-CM

## 2020-01-19 NOTE — Telephone Encounter (Signed)
LOV 12/25/19. Last ordered 01/04/20 as a courtesy. Patient has missed last 3 scheduled appointments. Attempted to reach patient by phone. Refusing refills at this time.

## 2020-01-23 ENCOUNTER — Other Ambulatory Visit: Payer: Self-pay | Admitting: Registered Nurse

## 2020-01-23 DIAGNOSIS — I10 Essential (primary) hypertension: Secondary | ICD-10-CM

## 2020-01-25 ENCOUNTER — Other Ambulatory Visit: Payer: Self-pay | Admitting: Registered Nurse

## 2020-01-25 DIAGNOSIS — F331 Major depressive disorder, recurrent, moderate: Secondary | ICD-10-CM

## 2020-01-25 DIAGNOSIS — I10 Essential (primary) hypertension: Secondary | ICD-10-CM

## 2020-01-25 NOTE — Telephone Encounter (Signed)
No further refills without office visit 

## 2020-01-25 NOTE — Telephone Encounter (Signed)
Please schedule patient a f/u visit for hypertension. 30 day supply has been sent

## 2020-01-25 NOTE — Telephone Encounter (Signed)
Patient called, recording unable to complete the call at this time, try again later.

## 2020-01-26 ENCOUNTER — Other Ambulatory Visit: Payer: Self-pay | Admitting: Registered Nurse

## 2020-01-26 DIAGNOSIS — G2581 Restless legs syndrome: Secondary | ICD-10-CM

## 2020-01-26 NOTE — Telephone Encounter (Signed)
Tried calling pt to sch a med refill appt. VM is not set up or phone is no longer in service. Will try again later

## 2020-01-27 NOTE — Telephone Encounter (Addendum)
01/27/2020 - PATIENT REQUESTING A REFILL ON HER LISINOPRIL 10 mg. I CALLED TO SCHEDULE HER AN OFFICE VISIT WITH RICH MORROW BUT PATIENT STATED SHE WANTS TO GET A NEW PROVIDER. I SCHEDULED AN OFFICE VISIT WITH KELSEA JUST FOR A MEDICATION REVIEW ON 02/15/2020. I ALSO SCHEDULED HER A TRANSFER OF CARE WITH KELSEA ON 05/04/2020. FELICIA K. HAS SENT HER IN A 30 DAY SUPPLY. [place]

## 2020-01-27 NOTE — Telephone Encounter (Signed)
We can not refill meds without appt. Waiting on pt at this point

## 2020-01-27 NOTE — Telephone Encounter (Signed)
Tried contacting pt many time but seems that phone is not active.

## 2020-02-10 ENCOUNTER — Telehealth: Payer: Self-pay | Admitting: *Deleted

## 2020-02-10 NOTE — Telephone Encounter (Signed)
Schedule AWV.  

## 2020-02-15 ENCOUNTER — Ambulatory Visit: Payer: Medicare Other | Admitting: Family Medicine

## 2020-02-16 ENCOUNTER — Encounter: Payer: Self-pay | Admitting: Family Medicine

## 2020-02-22 ENCOUNTER — Other Ambulatory Visit: Payer: Self-pay | Admitting: Registered Nurse

## 2020-02-22 DIAGNOSIS — I10 Essential (primary) hypertension: Secondary | ICD-10-CM

## 2020-02-22 NOTE — Telephone Encounter (Signed)
Courtesy refill until appt.05/04/20

## 2020-03-15 ENCOUNTER — Other Ambulatory Visit: Payer: Self-pay | Admitting: Registered Nurse

## 2020-03-15 DIAGNOSIS — G2581 Restless legs syndrome: Secondary | ICD-10-CM

## 2020-03-23 ENCOUNTER — Other Ambulatory Visit: Payer: Self-pay | Admitting: Registered Nurse

## 2020-03-23 DIAGNOSIS — I10 Essential (primary) hypertension: Secondary | ICD-10-CM

## 2020-03-23 NOTE — Telephone Encounter (Signed)
Requested medications are due for refill today yes  Requested medications are on the active medication list yes  Last refill 1/1 (mail order)  Last visit 12/2018  Future visit scheduled 05/04/20  Notes to clinic Was given curtesy refill in Jan but not enough to last until upcoming appt 05/04/20, (mail order), please assess.

## 2020-04-24 ENCOUNTER — Other Ambulatory Visit: Payer: Self-pay | Admitting: Registered Nurse

## 2020-04-24 DIAGNOSIS — I10 Essential (primary) hypertension: Secondary | ICD-10-CM

## 2020-04-24 DIAGNOSIS — F331 Major depressive disorder, recurrent, moderate: Secondary | ICD-10-CM

## 2020-04-25 ENCOUNTER — Telehealth: Payer: Self-pay | Admitting: Registered Nurse

## 2020-05-04 ENCOUNTER — Encounter: Payer: Medicare Other | Admitting: Family Medicine

## 2020-05-10 ENCOUNTER — Other Ambulatory Visit: Payer: Self-pay | Admitting: Registered Nurse

## 2020-05-10 DIAGNOSIS — G2581 Restless legs syndrome: Secondary | ICD-10-CM

## 2020-05-15 ENCOUNTER — Other Ambulatory Visit: Payer: Self-pay | Admitting: Registered Nurse

## 2020-05-15 DIAGNOSIS — F331 Major depressive disorder, recurrent, moderate: Secondary | ICD-10-CM

## 2020-05-15 DIAGNOSIS — I10 Essential (primary) hypertension: Secondary | ICD-10-CM

## 2020-05-17 NOTE — Telephone Encounter (Signed)
Pt needs appt before further fills.

## 2020-05-22 ENCOUNTER — Other Ambulatory Visit: Payer: Self-pay | Admitting: Registered Nurse

## 2020-05-22 DIAGNOSIS — G2581 Restless legs syndrome: Secondary | ICD-10-CM

## 2020-06-15 ENCOUNTER — Other Ambulatory Visit: Payer: Self-pay | Admitting: Registered Nurse

## 2020-06-15 DIAGNOSIS — G2581 Restless legs syndrome: Secondary | ICD-10-CM

## 2020-07-11 ENCOUNTER — Other Ambulatory Visit: Payer: Self-pay | Admitting: Registered Nurse

## 2020-07-11 DIAGNOSIS — G2581 Restless legs syndrome: Secondary | ICD-10-CM

## 2020-07-12 NOTE — Telephone Encounter (Signed)
Pt needs appt

## 2020-07-21 ENCOUNTER — Other Ambulatory Visit: Payer: Self-pay | Admitting: Registered Nurse

## 2020-07-21 DIAGNOSIS — G2581 Restless legs syndrome: Secondary | ICD-10-CM

## 2020-08-08 ENCOUNTER — Other Ambulatory Visit: Payer: Self-pay | Admitting: Registered Nurse

## 2020-08-08 DIAGNOSIS — J441 Chronic obstructive pulmonary disease with (acute) exacerbation: Secondary | ICD-10-CM

## 2020-08-10 ENCOUNTER — Other Ambulatory Visit: Payer: Self-pay | Admitting: Registered Nurse

## 2020-08-10 DIAGNOSIS — G2581 Restless legs syndrome: Secondary | ICD-10-CM

## 2020-09-19 ENCOUNTER — Other Ambulatory Visit: Payer: Self-pay | Admitting: Registered Nurse

## 2020-09-19 DIAGNOSIS — I10 Essential (primary) hypertension: Secondary | ICD-10-CM

## 2020-10-17 ENCOUNTER — Other Ambulatory Visit: Payer: Self-pay

## 2020-10-17 DIAGNOSIS — J441 Chronic obstructive pulmonary disease with (acute) exacerbation: Secondary | ICD-10-CM

## 2020-10-17 MED ORDER — ADVAIR HFA 115-21 MCG/ACT IN AERO: 2.0000 | INHALATION_SPRAY | Freq: Two times a day (BID) | RESPIRATORY_TRACT | 0 refills | Status: DC

## 2020-10-17 MED ORDER — SPIRIVA HANDIHALER 18 MCG IN CAPS: ORAL_CAPSULE | RESPIRATORY_TRACT | 0 refills | Status: DC

## 2020-11-03 ENCOUNTER — Other Ambulatory Visit: Payer: Self-pay | Admitting: Registered Nurse

## 2020-11-03 DIAGNOSIS — F5104 Psychophysiologic insomnia: Secondary | ICD-10-CM

## 2020-11-03 DIAGNOSIS — G8929 Other chronic pain: Secondary | ICD-10-CM

## 2020-11-03 DIAGNOSIS — G43809 Other migraine, not intractable, without status migrainosus: Secondary | ICD-10-CM

## 2020-11-03 DIAGNOSIS — M545 Low back pain, unspecified: Secondary | ICD-10-CM

## 2020-11-29 ENCOUNTER — Other Ambulatory Visit: Payer: Self-pay | Admitting: Registered Nurse

## 2020-11-29 DIAGNOSIS — I10 Essential (primary) hypertension: Secondary | ICD-10-CM

## 2020-12-04 ENCOUNTER — Other Ambulatory Visit: Payer: Self-pay | Admitting: Registered Nurse

## 2020-12-04 DIAGNOSIS — G8929 Other chronic pain: Secondary | ICD-10-CM

## 2020-12-04 DIAGNOSIS — F5104 Psychophysiologic insomnia: Secondary | ICD-10-CM

## 2020-12-04 DIAGNOSIS — G43809 Other migraine, not intractable, without status migrainosus: Secondary | ICD-10-CM

## 2020-12-04 NOTE — Telephone Encounter (Signed)
Patient is requesting a refill of the following medications: Requested Prescriptions   Pending Prescriptions Disp Refills   tiZANidine (ZANAFLEX) 4 MG tablet [Pharmacy Med Name: Tizanidine Hydrochloride 4mg  Tablet] 60 tablet 11    Sig: Take 1 tablet by mouth every 6 hours as needed for muscle spasms   QUEtiapine (SEROQUEL) 200 MG tablet [Pharmacy Med Name: Quetiapine Fumarate 200mg  Tablet] 30 tablet 11    Sig: Take 1 tablet by mouth at bedtime   topiramate (TOPAMAX) 25 MG tablet [Pharmacy Med Name: Topiramate 25mg  Tablet] 60 tablet 11    Sig: Take 1 tablet by mouth twice daily  Patient is due for an office visit.  Date of patient request: 12/04/2020 Last office visit: 12/25/2018 Date of last refill:  Last refill amount:  Follow up time period per chart: none

## 2021-03-15 ENCOUNTER — Encounter (HOSPITAL_COMMUNITY): Payer: Self-pay

## 2021-03-15 ENCOUNTER — Other Ambulatory Visit: Payer: Self-pay

## 2021-03-15 ENCOUNTER — Emergency Department (HOSPITAL_COMMUNITY)
Admission: EM | Admit: 2021-03-15 | Discharge: 2021-03-15 | Disposition: A | Discharge status: Home / Self Care | Payer: Medicare Other | Attending: Emergency Medicine | Admitting: Emergency Medicine

## 2021-03-15 DIAGNOSIS — Z7952 Long term (current) use of systemic steroids: Secondary | ICD-10-CM | POA: Insufficient documentation

## 2021-03-15 DIAGNOSIS — Z20822 Contact with and (suspected) exposure to covid-19: Secondary | ICD-10-CM | POA: Insufficient documentation

## 2021-03-15 DIAGNOSIS — B029 Zoster without complications: Secondary | ICD-10-CM

## 2021-03-15 DIAGNOSIS — R0600 Dyspnea, unspecified: Secondary | ICD-10-CM | POA: Diagnosis present

## 2021-03-15 DIAGNOSIS — Z79899 Other long term (current) drug therapy: Secondary | ICD-10-CM | POA: Diagnosis not present

## 2021-03-15 LAB — RESP PANEL BY RT-PCR (FLU A&B, COVID) ARPGX2
Influenza A by PCR: NEGATIVE
Influenza B by PCR: NEGATIVE
SARS Coronavirus 2 by RT PCR: NEGATIVE

## 2021-03-15 MED ORDER — HYDROCODONE-ACETAMINOPHEN 5-325 MG PO TABS: 1.0000 | ORAL_TABLET | Freq: Four times a day (QID) | ORAL | 0 refills | Status: DC | PRN

## 2021-03-15 MED ORDER — HYDROCODONE-ACETAMINOPHEN 5-325 MG PO TABS
1.0000 | ORAL_TABLET | Freq: Once | ORAL | Status: AC
  Administered 2021-03-15: 1 via ORAL
  Filled 2021-03-15: qty 1

## 2021-03-15 MED ORDER — CAPSAICIN 0.025 % EX CREA
TOPICAL_CREAM | Freq: Two times a day (BID) | CUTANEOUS | Status: DC
  Filled 2021-03-15: qty 60

## 2021-03-15 MED ORDER — PREDNISONE 20 MG PO TABS
60.0000 mg | ORAL_TABLET | ORAL | Status: AC
  Administered 2021-03-15: 60 mg via ORAL
  Filled 2021-03-15: qty 3

## 2021-03-15 MED ORDER — PREDNISONE 20 MG PO TABS
40.0000 mg | ORAL_TABLET | Freq: Every day | ORAL | 0 refills | Status: AC
Start: 2021-03-15 — End: 2021-03-21

## 2021-03-15 MED ORDER — VALACYCLOVIR HCL 1 G PO TABS: 1000.0000 mg | ORAL_TABLET | Freq: Three times a day (TID) | ORAL | 0 refills | Status: DC

## 2021-03-15 MED ORDER — VALACYCLOVIR HCL 500 MG PO TABS
1000.0000 mg | ORAL_TABLET | Freq: Once | ORAL | Status: AC
  Administered 2021-03-15: 1000 mg via ORAL
  Filled 2021-03-15: qty 2

## 2021-03-15 NOTE — ED Provider Notes (Signed)
[facility] DEPT Provider Note   CSN: [id number] Arrival date & time: 03/15/21  1010     History  No chief complaint on file.   Rita Hunter is a 51 y.o. female.  HPI Patient presents with concern of pain, cutaneous lesions.  Onset was 4 days ago.  Since that time with Tylenol, muscle relaxant she has had no relief.  She has some associated dyspnea, secondary to pain in the area.  No fever, no chest pain, no relief with anything.    Home Medications Prior to Admission medications   Medication Sig Start Date End Date Taking? Authorizing Provider  HYDROcodone-acetaminophen (NORCO/VICODIN) 5-325 MG tablet Take 1 tablet by mouth every 6 (six) hours as needed for severe pain. 03/15/21  Yes Carmin Muskrat, MD  predniSONE (DELTASONE) 20 MG tablet Take 2 tablets (40 mg total) by mouth daily with breakfast for 6 days. For the next four days 03/15/21 03/21/21 Yes Carmin Muskrat, MD  valACYclovir (VALTREX) 1000 MG tablet Take 1 tablet (1,000 mg total) by mouth 3 (three) times daily. 03/15/21  Yes Carmin Muskrat, MD  albuterol [facility] HFA) 108 [phone] Base) MCG/ACT inhaler Inhale 2 puffs by mouth every 4 hours as needed for wheezing or shortness of breath 06/10/18   Maximiano Coss, NP  fluticasone-salmeterol (ADVAIR HFA) 115-21 MCG/ACT inhaler Inhale 2 puffs into the lungs 2 (two) times daily. 10/17/20   Maximiano Coss, NP  gabapentin (NEURONTIN) 600 MG tablet Take 1 tablet by mouth 3 times a day 07/12/20   Maximiano Coss, NP  lisinopril (ZESTRIL) 10 MG tablet Take 1 tablet by mouth every day 09/19/20   Maximiano Coss, NP  metoprolol tartrate (LOPRESSOR) 50 MG tablet Take 1 tablet by mouth twice daily. Need appt 05/16/20   Maximiano Coss, NP  QUEtiapine (SEROQUEL) 200 MG tablet Take 1 tablet (200 mg total) by mouth at bedtime. 01/04/20   Maximiano Coss, NP  rOPINIRole (REQUIP) 2 MG tablet TAKE 1 TO 2 TABLETS(2 TO 4 MG) BY MOUTH AT BEDTIME 01/28/19   Maximiano Coss, NP   sertraline (ZOLOFT) 100 MG tablet Take 1 tablet by mouth every day. Need appt 05/16/20   Maximiano Coss, NP  tiotropium [facility] HANDIHALER) 18 MCG inhalation capsule Inhale the contents of 1 capsule via handihaler every day 10/17/20   Maximiano Coss, NP  tiZANidine (ZANAFLEX) 4 MG tablet Take 1 tablet by mouth every 6 hours as needed for muscle spasms 01/04/20   Maximiano Coss, NP  topiramate (TOPAMAX) 25 MG tablet Take 1 tablet (25 mg total) by mouth 2 (two) times daily. 01/04/20   Maximiano Coss, NP  varenicline (CHANTIX) 0.5 MG tablet Take 1 tablet (0.5 mg total) by mouth 2 (two) times daily. 06/10/18   Maximiano Coss, NP      Allergies    Mobic [meloxicam] and Ultram [place] Mode hcl]    Review of Systems   Review of Systems  Constitutional:        Per HPI, otherwise negative  HENT:         Per HPI, otherwise negative  Respiratory:         Per HPI, otherwise negative  Cardiovascular:        Per HPI, otherwise negative  Gastrointestinal:  Negative for vomiting.  Endocrine:       Negative aside from HPI  Genitourinary:        Neg aside from HPI   Musculoskeletal:        Per HPI, otherwise negative  Skin:  Positive  for rash.  Neurological:  Negative for syncope.   Physical Exam Updated Vital Signs BP (!) 137/109 (BP Location: Right Arm)    Pulse 95    Temp 98.7 F (37.1 C) (Oral)    Resp 20    Ht 5\' 8"  (1.727 m)    Wt 58.1 kg    SpO2 98%    BMI 19.46 kg/m  Physical Exam Vitals and nursing note reviewed.  Constitutional:      General: She is not in acute distress.    Appearance: She is well-developed.  HENT:     Head: Normocephalic and atraumatic.  Eyes:     Conjunctiva/sclera: Conjunctivae normal.  Cardiovascular:     Rate and Rhythm: Normal rate and regular rhythm.  Pulmonary:     Effort: Pulmonary effort is normal. No respiratory distress.  Abdominal:     General: There is no distension.  Skin:    General: Skin is warm and dry.       Neurological:     Mental  Status: She is alert and oriented to person, place, and time.     Cranial Nerves: No cranial nerve deficit.    ED Results / Procedures / Treatments   Labs (all labs ordered are listed, but only abnormal results are displayed) Labs Reviewed  RESP PANEL BY RT-PCR (FLU A&B, COVID) [id number]   EKG None  Radiology No results found.  Procedures Procedures    Medications Ordered in ED Medications  valACYclovir (VALTREX) tablet 1,000 mg (has no administration in time range)  predniSONE (DELTASONE) tablet 60 mg (has no administration in time range)  HYDROcodone-acetaminophen (NORCO/VICODIN) 5-325 MG per tablet 1 tablet (has no administration in time range)    ED Course/ Medical Decision Making/ A&P This patient presents to the ED for concern of dyspnea, rash, this involves an extensive number of treatment options, and is a complaint that carries with it a high risk of complications and morbidity.  The differential diagnosis includes shingles, COPD exacerbation, bacteremia, sepsis intra-abdominal pathology   Co morbidities that complicate the patient evaluation  History of COPD, hypertension, smoking history   Social Determinants of Health:  Cigarette addiction   Additional history obtained:  Additional history and/or information obtained from chart review External records from outside source obtained and reviewed including notes for COPD exacerbation of last year   Lab Tests:  I Ordered (or co-signed), and personally interpreted labs.  The pertinent results include: COVID testing unremarkable    The patient was also maintained on pulse oximetry. The readings were typically 99% on room air unremarkable.   Medicines ordered and prescription drug management:  I ordered medication including narcotics, steroids, antivirals Reevaluation of the patient after these medicines showed that the patient improved    Dispostion:  After consideration of the diagnostic results and  the patients response to treatment, I feel that the patent would benefit from discharge, though given her history of COPD, dyspnea hospitalization was a consideration.  Patient's evaluation here most consistent with a shingles outbreak, without hypoxia, new oxygen requirement, increased work of breathing, or fever there is low suspicion for substantial COPD exacerbation and/or pneumonia.  No evidence for bacteremia, sepsis, similar reasons as above.   Final Clinical Impression(s) / ED Diagnoses Final diagnoses:  Herpes zoster without complication    Rx / DC Orders ED Discharge Orders          Ordered    predniSONE (DELTASONE) 20 MG tablet  Daily with breakfast  03/15/21 1042    valACYclovir (VALTREX) 1000 MG tablet  3 times daily        03/15/21 1042    HYDROcodone-acetaminophen (NORCO/VICODIN) 5-325 MG tablet  Every 6 hours PRN        03/15/21 1042              Carmin Muskrat, MD 03/15/21 1119

## 2021-03-15 NOTE — Discharge Instructions (Addendum)
As discussed, your evaluation today has been largely reassuring.  But, it is important that you monitor your condition carefully, and do not hesitate to return to the ED if you develop new, or concerning changes in your condition. ? ?Otherwise, please follow-up with your physician for appropriate ongoing care. ? ?

## 2021-03-15 NOTE — ED Triage Notes (Signed)
Patient states generalized body pain since Sunday. Patient states that it could possibly be Shingles. Patient has taken Tylenol and muscle relaxants for pain relief. Patient states she is having shortness of breath. Hx of COPD and hypertension.

## 2021-03-17 ENCOUNTER — Other Ambulatory Visit: Payer: Self-pay | Admitting: Registered Nurse

## 2021-03-17 DIAGNOSIS — F331 Major depressive disorder, recurrent, moderate: Secondary | ICD-10-CM

## 2021-03-17 DIAGNOSIS — I10 Essential (primary) hypertension: Secondary | ICD-10-CM

## 2021-03-22 NOTE — Telephone Encounter (Signed)
Erroneous encounter. Please disregard.

## 2021-03-28 DIAGNOSIS — J441 Chronic obstructive pulmonary disease with (acute) exacerbation: Secondary | ICD-10-CM | POA: Diagnosis not present

## 2021-03-28 DIAGNOSIS — J449 Chronic obstructive pulmonary disease, unspecified: Secondary | ICD-10-CM | POA: Diagnosis not present

## 2021-03-28 DIAGNOSIS — J8410 Pulmonary fibrosis, unspecified: Secondary | ICD-10-CM | POA: Diagnosis not present

## 2021-03-28 DIAGNOSIS — F17210 Nicotine dependence, cigarettes, uncomplicated: Secondary | ICD-10-CM | POA: Diagnosis not present

## 2021-03-28 DIAGNOSIS — B029 Zoster without complications: Secondary | ICD-10-CM | POA: Diagnosis not present

## 2021-03-28 DIAGNOSIS — Z0000 Encounter for general adult medical examination without abnormal findings: Secondary | ICD-10-CM | POA: Diagnosis not present

## 2021-03-29 ENCOUNTER — Other Ambulatory Visit: Payer: Self-pay | Admitting: Family Medicine

## 2021-03-29 DIAGNOSIS — F17210 Nicotine dependence, cigarettes, uncomplicated: Secondary | ICD-10-CM

## 2021-03-29 DIAGNOSIS — Z1231 Encounter for screening mammogram for malignant neoplasm of breast: Secondary | ICD-10-CM

## 2021-03-29 DIAGNOSIS — Z122 Encounter for screening for malignant neoplasm of respiratory organs: Secondary | ICD-10-CM

## 2021-05-07 ENCOUNTER — Other Ambulatory Visit (HOSPITAL_COMMUNITY): Payer: Self-pay | Admitting: Family Medicine

## 2021-05-07 DIAGNOSIS — I10 Essential (primary) hypertension: Secondary | ICD-10-CM

## 2021-05-21 ENCOUNTER — Ambulatory Visit (HOSPITAL_COMMUNITY): Admission: RE | Admit: 2021-05-21 | Payer: Medicare Other | Source: Ambulatory Visit

## 2021-08-04 ENCOUNTER — Telehealth (HOSPITAL_COMMUNITY): Payer: Medicare Other | Admitting: Psychiatry

## 2023-12-26 ENCOUNTER — Inpatient Hospital Stay (HOSPITAL_COMMUNITY)
Admission: EM | Admit: 2023-12-26 | Discharge: 2023-12-31 | DRG: 189 | Disposition: A | Discharge status: Home / Self Care | Attending: Emergency Medicine | Admitting: Emergency Medicine

## 2023-12-26 ENCOUNTER — Inpatient Hospital Stay (HOSPITAL_COMMUNITY)

## 2023-12-26 ENCOUNTER — Other Ambulatory Visit: Payer: Self-pay

## 2023-12-26 ENCOUNTER — Encounter (HOSPITAL_COMMUNITY): Payer: Self-pay | Admitting: Emergency Medicine

## 2023-12-26 ENCOUNTER — Emergency Department (HOSPITAL_COMMUNITY)

## 2023-12-26 DIAGNOSIS — Z5902 Unsheltered homelessness: Secondary | ICD-10-CM | POA: Diagnosis not present

## 2023-12-26 DIAGNOSIS — R051 Acute cough: Secondary | ICD-10-CM

## 2023-12-26 DIAGNOSIS — F32A Depression, unspecified: Secondary | ICD-10-CM | POA: Diagnosis not present

## 2023-12-26 DIAGNOSIS — R9389 Abnormal findings on diagnostic imaging of other specified body structures: Secondary | ICD-10-CM

## 2023-12-26 DIAGNOSIS — R079 Chest pain, unspecified: Secondary | ICD-10-CM | POA: Diagnosis not present

## 2023-12-26 DIAGNOSIS — J439 Emphysema, unspecified: Secondary | ICD-10-CM | POA: Diagnosis present

## 2023-12-26 DIAGNOSIS — Z716 Tobacco abuse counseling: Secondary | ICD-10-CM | POA: Diagnosis not present

## 2023-12-26 DIAGNOSIS — Z888 Allergy status to other drugs, medicaments and biological substances status: Secondary | ICD-10-CM | POA: Diagnosis not present

## 2023-12-26 DIAGNOSIS — R0602 Shortness of breath: Secondary | ICD-10-CM

## 2023-12-26 DIAGNOSIS — Z833 Family history of diabetes mellitus: Secondary | ICD-10-CM | POA: Diagnosis not present

## 2023-12-26 DIAGNOSIS — R0902 Hypoxemia: Principal | ICD-10-CM

## 2023-12-26 DIAGNOSIS — Z8249 Family history of ischemic heart disease and other diseases of the circulatory system: Secondary | ICD-10-CM | POA: Diagnosis not present

## 2023-12-26 DIAGNOSIS — K5900 Constipation, unspecified: Secondary | ICD-10-CM | POA: Diagnosis not present

## 2023-12-26 DIAGNOSIS — I272 Pulmonary hypertension, unspecified: Secondary | ICD-10-CM | POA: Diagnosis not present

## 2023-12-26 DIAGNOSIS — R7989 Other specified abnormal findings of blood chemistry: Secondary | ICD-10-CM | POA: Diagnosis not present

## 2023-12-26 DIAGNOSIS — F17210 Nicotine dependence, cigarettes, uncomplicated: Secondary | ICD-10-CM | POA: Diagnosis not present

## 2023-12-26 DIAGNOSIS — Z79899 Other long term (current) drug therapy: Secondary | ICD-10-CM

## 2023-12-26 DIAGNOSIS — T50916A Underdosing of multiple unspecified drugs, medicaments and biological substances, initial encounter: Secondary | ICD-10-CM | POA: Diagnosis present

## 2023-12-26 DIAGNOSIS — J9601 Acute respiratory failure with hypoxia: Secondary | ICD-10-CM | POA: Diagnosis not present

## 2023-12-26 DIAGNOSIS — M549 Dorsalgia, unspecified: Secondary | ICD-10-CM | POA: Diagnosis present

## 2023-12-26 DIAGNOSIS — R918 Other nonspecific abnormal finding of lung field: Secondary | ICD-10-CM | POA: Diagnosis present

## 2023-12-26 DIAGNOSIS — F419 Anxiety disorder, unspecified: Secondary | ICD-10-CM | POA: Diagnosis present

## 2023-12-26 DIAGNOSIS — Z5900 Homelessness unspecified: Secondary | ICD-10-CM | POA: Diagnosis present

## 2023-12-26 DIAGNOSIS — Z886 Allergy status to analgesic agent status: Secondary | ICD-10-CM

## 2023-12-26 DIAGNOSIS — F4310 Post-traumatic stress disorder, unspecified: Secondary | ICD-10-CM | POA: Diagnosis not present

## 2023-12-26 DIAGNOSIS — M62838 Other muscle spasm: Secondary | ICD-10-CM | POA: Diagnosis present

## 2023-12-26 DIAGNOSIS — Z789 Other specified health status: Secondary | ICD-10-CM

## 2023-12-26 DIAGNOSIS — Z823 Family history of stroke: Secondary | ICD-10-CM | POA: Diagnosis not present

## 2023-12-26 DIAGNOSIS — Z5941 Food insecurity: Secondary | ICD-10-CM

## 2023-12-26 DIAGNOSIS — J441 Chronic obstructive pulmonary disease with (acute) exacerbation: Secondary | ICD-10-CM | POA: Diagnosis not present

## 2023-12-26 DIAGNOSIS — I10 Essential (primary) hypertension: Secondary | ICD-10-CM | POA: Diagnosis present

## 2023-12-26 DIAGNOSIS — G2581 Restless legs syndrome: Secondary | ICD-10-CM

## 2023-12-26 DIAGNOSIS — J432 Centrilobular emphysema: Secondary | ICD-10-CM | POA: Diagnosis not present

## 2023-12-26 DIAGNOSIS — F5104 Psychophysiologic insomnia: Secondary | ICD-10-CM

## 2023-12-26 DIAGNOSIS — F331 Major depressive disorder, recurrent, moderate: Secondary | ICD-10-CM

## 2023-12-26 DIAGNOSIS — F209 Schizophrenia, unspecified: Secondary | ICD-10-CM | POA: Diagnosis present

## 2023-12-26 DIAGNOSIS — J45901 Unspecified asthma with (acute) exacerbation: Secondary | ICD-10-CM | POA: Diagnosis not present

## 2023-12-26 DIAGNOSIS — R0609 Other forms of dyspnea: Secondary | ICD-10-CM | POA: Diagnosis not present

## 2023-12-26 DIAGNOSIS — G8929 Other chronic pain: Secondary | ICD-10-CM | POA: Diagnosis present

## 2023-12-26 DIAGNOSIS — R030 Elevated blood-pressure reading, without diagnosis of hypertension: Secondary | ICD-10-CM

## 2023-12-26 LAB — COMPREHENSIVE METABOLIC PANEL WITH GFR
ALT: 43 U/L (ref 0–44)
AST: 47 U/L — ABNORMAL HIGH (ref 15–41)
Albumin: 3.5 g/dL (ref 3.5–5.0)
Alkaline Phosphatase: 47 U/L (ref 38–126)
Anion gap: 11 (ref 5–15)
BUN: 9 mg/dL (ref 6–20)
CO2: 26 mmol/L (ref 22–32)
Calcium: 8.5 mg/dL — ABNORMAL LOW (ref 8.9–10.3)
Chloride: 102 mmol/L (ref 98–111)
Creatinine, Ser: 0.71 mg/dL (ref 0.44–1.00)
GFR, Estimated: >60 mL/min (ref >60)
Glucose, Bld: 135 mg/dL — ABNORMAL HIGH (ref 70–99)
Potassium: 3.6 mmol/L (ref 3.5–5.1)
Sodium: 139 mmol/L (ref 135–145)
Total Bilirubin: 0.8 mg/dL (ref 0.0–1.2)
Total Protein: 6.7 g/dL (ref 6.5–8.1)

## 2023-12-26 LAB — CBC WITH DIFFERENTIAL/PLATELET
Abs Immature Granulocytes: 0.04 K/uL (ref 0.00–0.07)
Basophils Absolute: 0.1 K/uL (ref 0.0–0.1)
Basophils Relative: 0 %
Eosinophils Absolute: 0.2 K/uL (ref 0.0–0.5)
Eosinophils Relative: 2 %
HCT: 45.6 % (ref 36.0–46.0)
Hemoglobin: 14.8 g/dL (ref 12.0–15.0)
Immature Granulocytes: 0 %
Lymphocytes Relative: 34 %
Lymphs Abs: 4.3 K/uL — ABNORMAL HIGH (ref 0.7–4.0)
MCH: 30.3 pg (ref 26.0–34.0)
MCHC: 32.5 g/dL (ref 30.0–36.0)
MCV: 93.4 fL (ref 80.0–100.0)
Monocytes Absolute: 1.1 K/uL — ABNORMAL HIGH (ref 0.1–1.0)
Monocytes Relative: 9 %
Neutro Abs: 7 K/uL (ref 1.7–7.7)
Neutrophils Relative %: 55 %
Platelets: 264 K/uL (ref 150–400)
RBC: 4.88 MIL/uL (ref 3.87–5.11)
RDW: 12.9 % (ref 11.5–15.5)
WBC: 12.7 K/uL — ABNORMAL HIGH (ref 4.0–10.5)
nRBC: 0 % (ref 0.0–0.2)

## 2023-12-26 LAB — BRAIN NATRIURETIC PEPTIDE: B Natriuretic Peptide: 705.6 pg/mL — ABNORMAL HIGH (ref 0.0–100.0)

## 2023-12-26 LAB — RESP PANEL BY RT-PCR (RSV, FLU A&B, COVID)  RVPGX2
Influenza A by PCR: NEGATIVE
Influenza B by PCR: NEGATIVE
Resp Syncytial Virus by PCR: NEGATIVE
SARS Coronavirus 2 by RT PCR: NEGATIVE

## 2023-12-26 LAB — URINALYSIS, W/ REFLEX TO CULTURE (INFECTION SUSPECTED)
Bilirubin Urine: NEGATIVE
Glucose, UA: NEGATIVE mg/dL
Hgb urine dipstick: NEGATIVE
Ketones, ur: NEGATIVE mg/dL
Leukocytes,Ua: NEGATIVE
Nitrite: NEGATIVE
Protein, ur: 30 mg/dL — AB
Specific Gravity, Urine: >1.046 — ABNORMAL HIGH (ref 1.005–1.030)
pH: 6 (ref 5.0–8.0)

## 2023-12-26 LAB — I-STAT CHEM 8, ED
BUN: 12 mg/dL (ref 6–20)
Calcium, Ion: 1.15 mmol/L (ref 1.15–1.40)
Chloride: 103 mmol/L (ref 98–111)
Creatinine, Ser: 0.6 mg/dL (ref 0.44–1.00)
Glucose, Bld: 131 mg/dL — ABNORMAL HIGH (ref 70–99)
HCT: 45 % (ref 36.0–46.0)
Hemoglobin: 15.3 g/dL — ABNORMAL HIGH (ref 12.0–15.0)
Potassium: 3.7 mmol/L (ref 3.5–5.1)
Sodium: 139 mmol/L (ref 135–145)
TCO2: 30 mmol/L (ref 22–32)

## 2023-12-26 LAB — TROPONIN I (HIGH SENSITIVITY)
Troponin I (High Sensitivity): 10 ng/L (ref <18)
Troponin I (High Sensitivity): 9 ng/L (ref <18)

## 2023-12-26 LAB — RAPID URINE DRUG SCREEN, HOSP PERFORMED
Amphetamines: NOT DETECTED
Barbiturates: NOT DETECTED
Benzodiazepines: NOT DETECTED
Cocaine: POSITIVE — AB
Opiates: NOT DETECTED
Tetrahydrocannabinol: NOT DETECTED

## 2023-12-26 LAB — I-STAT CG4 LACTIC ACID, ED
Lactic Acid, Venous: 0.6 mmol/L (ref 0.5–1.9)
Lactic Acid, Venous: 0.5 mmol/L (ref 0.5–1.9)

## 2023-12-26 LAB — LIPASE, BLOOD: Lipase: 53 U/L — ABNORMAL HIGH (ref 11–51)

## 2023-12-26 MED ORDER — FENTANYL CITRATE (PF) 50 MCG/ML IJ SOSY
50.0000 ug | PREFILLED_SYRINGE | Freq: Once | INTRAMUSCULAR | Status: AC
  Administered 2023-12-26: 50 ug via INTRAVENOUS
  Filled 2023-12-26: qty 1

## 2023-12-26 MED ORDER — ACETAMINOPHEN 650 MG RE SUPP: 650.0000 mg | Freq: Four times a day (QID) | RECTAL | Status: DC | PRN

## 2023-12-26 MED ORDER — UMECLIDINIUM-VILANTEROL 62.5-25 MCG/ACT IN AEPB
1.0000 | INHALATION_SPRAY | Freq: Every day | RESPIRATORY_TRACT | Status: DC
  Administered 2023-12-26 – 2023-12-31 (×6): 1 via RESPIRATORY_TRACT
  Filled 2023-12-26 (×2): qty 14

## 2023-12-26 MED ORDER — ONDANSETRON HCL 4 MG PO TABS: 4.0000 mg | ORAL_TABLET | Freq: Four times a day (QID) | ORAL | Status: DC | PRN

## 2023-12-26 MED ORDER — OXYCODONE HCL 5 MG PO TABS
5.0000 mg | ORAL_TABLET | Freq: Once | ORAL | Status: AC
  Administered 2023-12-26: 5 mg via ORAL
  Filled 2023-12-26: qty 1

## 2023-12-26 MED ORDER — ALBUTEROL SULFATE (2.5 MG/3ML) 0.083% IN NEBU
2.5000 mg | INHALATION_SOLUTION | Freq: Four times a day (QID) | RESPIRATORY_TRACT | Status: DC
  Administered 2023-12-26 – 2023-12-27 (×4): 2.5 mg via RESPIRATORY_TRACT
  Filled 2023-12-26 (×4): qty 3

## 2023-12-26 MED ORDER — LIDOCAINE 5 % EX PTCH
1.0000 | MEDICATED_PATCH | CUTANEOUS | Status: DC
  Administered 2023-12-26: 1 via TRANSDERMAL
  Filled 2023-12-26: qty 1

## 2023-12-26 MED ORDER — ORAL CARE MOUTH RINSE: 15.0000 mL | OROMUCOSAL | Status: DC | PRN

## 2023-12-26 MED ORDER — ENOXAPARIN SODIUM 40 MG/0.4ML IJ SOSY
40.0000 mg | PREFILLED_SYRINGE | INTRAMUSCULAR | Status: DC
  Administered 2023-12-26 – 2023-12-29 (×3): 40 mg via SUBCUTANEOUS
  Filled 2023-12-26 (×4): qty 0.4

## 2023-12-26 MED ORDER — ACETAMINOPHEN 325 MG PO TABS
650.0000 mg | ORAL_TABLET | Freq: Four times a day (QID) | ORAL | Status: DC | PRN
Start: 2023-12-26 — End: 2023-12-27
  Administered 2023-12-26 – 2023-12-27 (×2): 650 mg via ORAL
  Filled 2023-12-26 (×2): qty 2

## 2023-12-26 MED ORDER — ONDANSETRON HCL 4 MG/2ML IJ SOLN: 4.0000 mg | Freq: Four times a day (QID) | INTRAMUSCULAR | Status: DC | PRN

## 2023-12-26 MED ORDER — ALBUTEROL SULFATE HFA 108 (90 BASE) MCG/ACT IN AERS: 2.0000 | INHALATION_SPRAY | Freq: Four times a day (QID) | RESPIRATORY_TRACT | Status: DC

## 2023-12-26 MED ORDER — SODIUM CHLORIDE 0.9 % IV SOLN
500.0000 mg | INTRAVENOUS | Status: DC
  Administered 2023-12-26: 500 mg via INTRAVENOUS
  Filled 2023-12-26: qty 5

## 2023-12-26 MED ORDER — LABETALOL HCL 5 MG/ML IV SOLN
10.0000 mg | Freq: Once | INTRAVENOUS | Status: AC
Start: 2023-12-26 — End: 2023-12-26
  Administered 2023-12-26: 10 mg via INTRAVENOUS
  Filled 2023-12-26: qty 4

## 2023-12-26 MED ORDER — LORAZEPAM 2 MG/ML IJ SOLN
1.0000 mg | Freq: Once | INTRAMUSCULAR | Status: AC
Start: 2023-12-26 — End: 2023-12-26
  Administered 2023-12-26: 1 mg via INTRAVENOUS
  Filled 2023-12-26: qty 1

## 2023-12-26 MED ORDER — HYDROXYZINE HCL 25 MG PO TABS
25.0000 mg | ORAL_TABLET | Freq: Three times a day (TID) | ORAL | Status: DC | PRN
  Administered 2023-12-26 – 2023-12-30 (×12): 25 mg via ORAL
  Filled 2023-12-26 (×12): qty 1

## 2023-12-26 MED ORDER — POLYETHYLENE GLYCOL 3350 17 G PO PACK
17.0000 g | PACK | Freq: Every day | ORAL | Status: DC | PRN
  Administered 2023-12-28: 17 g via ORAL
  Filled 2023-12-26: qty 1

## 2023-12-26 MED ORDER — IOHEXOL 350 MG/ML SOLN
100.0000 mL | Freq: Once | INTRAVENOUS | Status: AC | PRN
  Administered 2023-12-26: 100 mL via INTRAVENOUS

## 2023-12-26 MED ORDER — PREDNISONE 20 MG PO TABS
40.0000 mg | ORAL_TABLET | Freq: Every day | ORAL | Status: AC
  Administered 2023-12-27 – 2023-12-31 (×5): 40 mg via ORAL
  Filled 2023-12-26 (×5): qty 2

## 2023-12-26 NOTE — TOC CM/SW Note (Signed)
 TOC consult received. Per consult patient is currently homeless. SW aware.   Merilee Batty, MSN, RN Case Management

## 2023-12-26 NOTE — Plan of Care (Signed)

## 2023-12-26 NOTE — ED Triage Notes (Signed)
 Pt arrived via [facility] c/o SOB. Pt has significant hx of advanced COPD. Pt has been out of medications since 3 days ago. Pt noticed breathing got much worse last night, waited to call this morning. Pt 86% RA upon EMS arrival. Pt received these treatments from EMS during transport:  -10mg  albuterol  -1mg  atrovent  -125mg  solu-medrol  -2g mag

## 2023-12-26 NOTE — ED Provider Notes (Signed)
 [place] EMERGENCY DEPARTMENT AT [facility] Provider Note   CSN: [id number] Arrival date & time: 12/26/23  9260     Patient presents with: Shortness of Breath   Rita Hunter is a 53 y.o. female.   The history is provided by the patient, medical records and the EMS personnel. No language interpreter was used.  Shortness of Breath Severity:  Severe Onset quality:  Gradual Duration:  3 days Timing:  Constant Progression:  Worsening Chronicity:  New Context: URI and weather changes   Relieved by:  Nothing Worsened by:  Nothing Ineffective treatments:  None tried Associated symptoms: abdominal pain, chest pain, cough, sputum production and wheezing   Associated symptoms: no fever, no headaches, no neck pain, no rash and no vomiting        Prior to Admission medications   Medication Sig Start Date End Date Taking? Authorizing Provider  albuterol  (PROAIR  HFA) 108 (90 Base) MCG/ACT inhaler Inhale 2 puffs by mouth every 4 hours as needed for wheezing or shortness of breath 06/10/18   Kip Ade, NP  fluticasone -salmeterol (ADVAIR  HFA) 115-21 MCG/ACT inhaler Inhale 2 puffs into the lungs 2 (two) times daily. 10/17/20   Kip Ade, NP  gabapentin  (NEURONTIN ) 600 MG tablet Take 1 tablet by mouth 3 times a day 07/12/20   Kip Ade, NP  HYDROcodone -acetaminophen  (NORCO/VICODIN) 5-325 MG tablet Take 1 tablet by mouth every 6 (six) hours as needed for severe pain. 03/15/21   Garrick Charleston, MD  lisinopril  (ZESTRIL ) 10 MG tablet Take 1 tablet by mouth every day 09/19/20   Kip Ade, NP  metoprolol  tartrate (LOPRESSOR ) 50 MG tablet Take 1 tablet by mouth twice daily. Need appt 05/16/20   Kip Ade, NP  QUEtiapine  (SEROQUEL ) 200 MG tablet Take 1 tablet (200 mg total) by mouth at bedtime. 01/04/20   Kip Ade, NP  rOPINIRole  (REQUIP ) 2 MG tablet TAKE 1 TO 2 TABLETS(2 TO 4 MG) BY MOUTH AT BEDTIME 01/28/19   Kip Ade, NP  sertraline  (ZOLOFT ) 100 MG  tablet Take 1 tablet by mouth every day. Need appt 05/16/20   Kip Ade, NP  tiotropium (SPIRIVA  HANDIHALER) 18 MCG inhalation capsule Inhale the contents of 1 capsule via handihaler every day 10/17/20   Kip Ade, NP  tiZANidine  (ZANAFLEX ) 4 MG tablet Take 1 tablet by mouth every 6 hours as needed for muscle spasms 01/04/20   Kip Ade, NP  topiramate  (TOPAMAX ) 25 MG tablet Take 1 tablet (25 mg total) by mouth 2 (two) times daily. 01/04/20   Kip Ade, NP  valACYclovir  (VALTREX ) 1000 MG tablet Take 1 tablet (1,000 mg total) by mouth 3 (three) times daily. 03/15/21   Garrick Charleston, MD  varenicline  (CHANTIX ) 0.5 MG tablet Take 1 tablet (0.5 mg total) by mouth 2 (two) times daily. 06/10/18   Kip Ade, NP    Allergies: Mobic [meloxicam] and Ultram [tramadol hcl]    Review of Systems  Constitutional:  Positive for fatigue. Negative for chills and fever.  HENT:  Positive for congestion.   Eyes:  Negative for visual disturbance.  Respiratory:  Positive for cough, sputum production, chest tightness, shortness of breath and wheezing. Negative for stridor.   Cardiovascular:  Positive for chest pain. Negative for palpitations and leg swelling.  Gastrointestinal:  Positive for abdominal pain. Negative for constipation, diarrhea, nausea and vomiting.  Genitourinary:  Negative for dysuria.  Musculoskeletal:  Negative for back pain and neck pain.  Skin:  Negative for rash and wound.  Neurological:  Negative for  weakness, light-headedness, numbness and headaches.  Psychiatric/Behavioral:  Negative for agitation.   All other systems reviewed and are negative.   Updated Vital Signs BP 137/88 (BP Location: Left Arm)   Pulse 74   Temp 98.1 F (36.7 C) (Oral)   Resp 20   SpO2 99%   Physical Exam Vitals and nursing note reviewed.  Constitutional:      General: She is not in acute distress.    Appearance: She is well-developed. She is not ill-appearing, toxic-appearing or  diaphoretic.  HENT:     Head: Normocephalic and atraumatic.     Nose: No congestion or rhinorrhea.     Mouth/Throat:     Mouth: Mucous membranes are moist.  Eyes:     Conjunctiva/sclera: Conjunctivae normal.     Pupils: Pupils are equal, round, and reactive to light.  Cardiovascular:     Rate and Rhythm: Normal rate and regular rhythm.     Heart sounds: No murmur heard. Pulmonary:     Effort: Respiratory distress present.     Breath sounds: Wheezing, rhonchi and rales present.  Abdominal:     Palpations: Abdomen is soft.     Tenderness: There is no abdominal tenderness. There is no guarding or rebound.  Musculoskeletal:        General: No swelling or tenderness.     Cervical back: Neck supple.     Right lower leg: No edema.     Left lower leg: No edema.  Skin:    General: Skin is warm and dry.     Capillary Refill: Capillary refill takes less than 2 seconds.     Findings: No erythema or rash.  Neurological:     General: No focal deficit present.     Mental Status: She is alert.  Psychiatric:        Mood and Affect: Mood normal.     (all labs ordered are listed, but only abnormal results are displayed) Labs Reviewed  BRAIN NATRIURETIC PEPTIDE - Abnormal; Notable for the following components:      Result Value   B Natriuretic Peptide 705.6 (*)    All other components within normal limits  CBC WITH DIFFERENTIAL/PLATELET - Abnormal; Notable for the following components:   WBC 12.7 (*)    Lymphs Abs 4.3 (*)    Monocytes Absolute 1.1 (*)    All other components within normal limits  COMPREHENSIVE METABOLIC PANEL WITH GFR - Abnormal; Notable for the following components:   Glucose, Bld 135 (*)    Calcium 8.5 (*)    AST 47 (*)    All other components within normal limits  LIPASE, BLOOD - Abnormal; Notable for the following components:   Lipase 53 (*)    All other components within normal limits  I-STAT CHEM 8, ED - Abnormal; Notable for the following components:    Glucose, Bld 131 (*)    Hemoglobin 15.3 (*)    All other components within normal limits  RESP PANEL BY RT-PCR (RSV, FLU A&B, COVID)  RVPGX2  URINALYSIS, W/ REFLEX TO CULTURE (INFECTION SUSPECTED)  I-STAT CG4 LACTIC ACID, ED  I-STAT CG4 LACTIC ACID, ED  TROPONIN I (HIGH SENSITIVITY)  TROPONIN I (HIGH SENSITIVITY)    EKG: EKG Interpretation Date/Time:  Friday December 26 2023 07:59:41 EST Ventricular Rate:  81 PR Interval:  143 QRS Duration:  94 QT Interval:  432 QTC Calculation: 502 R Axis:   58  Text Interpretation: Sinus rhythm Biatrial enlargement Left ventricular hypertrophy Borderline prolonged QT  interval no prior ECG for comparison no STEMI Confirmed by Ginger Barefoot ([id number]) on 12/26/2023 8:03:32 AM  Radiology: CT Angio Chest/Abd/Pel for Dissection W and/or Wo Contrast Result Date: 12/26/2023 CLINICAL DATA:  Chest pain radiates to the back. Clinical concern for acute aortic syndrome. EXAM: CT ANGIOGRAPHY CHEST, ABDOMEN AND PELVIS TECHNIQUE: Non-contrast CT of the chest was initially obtained. Multidetector CT imaging through the chest, abdomen and pelvis was performed using the standard protocol during bolus administration of intravenous contrast. Multiplanar reconstructed images and MIPs were obtained and reviewed to evaluate the vascular anatomy. RADIATION DOSE REDUCTION: This exam was performed according to the departmental dose-optimization program which includes automated exposure control, adjustment of the mA and/or kV according to patient size and/or use of iterative reconstruction technique. CONTRAST:  100mL OMNIPAQUE IOHEXOL 350 MG/ML SOLN COMPARISON:  No comparison studies available. FINDINGS: CTA CHEST FINDINGS Cardiovascular: Pre contrast imaging shows no hyperdense crescent in the wall of the thoracic aorta to suggest the presence of an acute intramural hematoma. No thoracic aortic aneurysm. No dissection of the thoracic aorta no large central pulmonary embolus The  heart size is normal. No substantial pericardial effusion. Mediastinum/Nodes: No mediastinal lymphadenopathy. There is no hilar lymphadenopathy. The esophagus has normal imaging features. There is no axillary lymphadenopathy. Lungs/Pleura: Centrilobular emphsyema noted. Multiple bilateral pulmonary nodules identified. Index 10 x 5 mm right upper lobe nodule seen 42/6. 3 mm left upper lobe nodule on 56/6. 3 mm right upper lobe nodule on 62/6. 11 mm perifissural right infrahilar nodule on 95/6. Calcified granuloma noted right lower lobe. No focal consolidation or pleural effusion. Musculoskeletal: No worrisome lytic or sclerotic osseous abnormality. Review of the MIP images confirms the above findings. CTA ABDOMEN AND PELVIS FINDINGS VASCULAR Aorta: Normal caliber aorta without aneurysm, dissection, vasculitis or significant stenosis. Minimal atherosclerosis. Celiac: Patent without evidence of aneurysm, dissection, vasculitis or significant stenosis. SMA: Patent without evidence of aneurysm, dissection, vasculitis or significant stenosis. Renals: Both renal arteries are patent without evidence of aneurysm, dissection, vasculitis, fibromuscular dysplasia or significant stenosis. IMA: Patent without evidence of aneurysm, dissection, vasculitis or significant stenosis. Inflow: Patent without evidence of aneurysm, dissection, vasculitis or significant stenosis. Veins: No obvious venous abnormality within the limitations of this arterial phase study. Review of the MIP images confirms the above findings. NON-VASCULAR Hepatobiliary: No suspicious focal abnormality within the liver parenchyma. There is no evidence for gallstones, gallbladder wall thickening, or pericholecystic fluid. No intrahepatic or extrahepatic biliary dilation. Pancreas: No focal mass lesion. No dilatation of the main duct. No intraparenchymal cyst. No peripancreatic edema. Spleen: No splenomegaly. No suspicious focal mass lesion. Adrenals/Urinary Tract:  Kidneys unremarkable. No evidence for hydroureter. The urinary bladder appears normal for the degree of distention. Stomach/Bowel: Stomach is unremarkable. No gastric wall thickening. No evidence of outlet obstruction. Duodenum is normally positioned as is the ligament of Treitz. No small bowel wall thickening. No small bowel dilatation. The terminal ileum is normal. The appendix is normal. No gross colonic mass. No colonic wall thickening. Lymphatic: There is no gastrohepatic or hepatoduodenal ligament lymphadenopathy. No retroperitoneal or mesenteric lymphadenopathy. No pelvic sidewall lymphadenopathy. Reproductive: There is no adnexal mass. Other: No intraperitoneal free fluid. Musculoskeletal: No worrisome lytic or sclerotic osseous abnormality. Small sclerotic focus left inferior pubic ramus is likely a bone island. Lumbosacral fusion hardware evident. Review of the MIP images confirms the above findings. IMPRESSION: 1. No evidence for acute intramural hematoma or dissection of the thoracoabdominal aorta. No thoracoabdominal aortic aneurysm. 2. Multiple bilateral pulmonary nodules measuring  up to 11 mm and warranting close follow-up to exclude neoplasm. Given dominant nodules measuring in the 10-11 mm size range, PET-CT may prove helpful to further evaluate. 3. No acute findings in the abdomen or pelvis. Electronically Signed   By: Camellia Candle M.D.   On: 12/26/2023 10:46   DG Chest Portable 1 View Result Date: 12/26/2023 EXAM: 1 VIEW(S) XRAY OF THE CHEST 12/26/2023 08:03:00 AM COMPARISON: None available. CLINICAL HISTORY: SOB, CP, cough, hypoxia, back pain, BP over 200 FINDINGS: LUNGS AND PLEURA: Hyperinflated lungs with coarsening of the pulmonary interstitium, likely reflecting emphysema. Calcified granuloma in right midlung zone. No pleural effusion. No pneumothorax. HEART AND MEDIASTINUM: No acute abnormality of the cardiac and mediastinal silhouettes. BONES AND SOFT TISSUES: Healed, remote right  clavicular fracture. Multilevel thoracic osteophytosis. IMPRESSION: 1. Emphysema. No acute cardiopulmonary abnormality. Electronically signed by: Rogelia Myers MD 12/26/2023 08:12 AM EST RP Workstation: [id number]     Procedures   CRITICAL CARE Performed by: Lonni PARAS Chaquetta Schlottman Total critical care time: 35 minutes Critical care time was exclusive of separately billable procedures and treating other patients. Critical care was necessary to treat or prevent imminent or life-threatening deterioration. Critical care was time spent personally by me on the following activities: development of treatment plan with patient and/or surrogate as well as nursing, discussions with consultants, evaluation of patient's response to treatment, examination of patient, obtaining history from patient or surrogate, ordering and performing treatments and interventions, ordering and review of laboratory studies, ordering and review of radiographic studies, pulse oximetry and re-evaluation of patient's condition.   Medications Ordered in the ED  labetalol (NORMODYNE) injection 10 mg (10 mg Intravenous Given 12/26/23 0809)  fentaNYL  (SUBLIMAZE ) injection 50 mcg (50 mcg Intravenous Given 12/26/23 0807)  LORazepam (ATIVAN) injection 1 mg (1 mg Intravenous Given 12/26/23 0839)  iohexol (OMNIPAQUE) 350 MG/ML injection 100 mL (100 mLs Intravenous Contrast Given 12/26/23 1019)                                    Medical Decision Making Amount and/or Complexity of Data Reviewed Labs: ordered. Radiology: ordered.  Risk Prescription drug management. Decision regarding hospitalization.    Rita Hunter is a 53 y.o. female with a past medical history significant for anxiety, depression, asthma, COPD, hypertension, and patient report to nursing of previous schizophrenia who presents for chest pain, shortness of breath, cough, and hypoxia.  According to patient, for the last 3 days patient has been out of her  medications including COPD medication blood pressure medicine.  She has been having productive cough and wheezing and oxygen saturation was found to be 86% on room air at home.  She does not take oxygen.  EMS concerned about COPD exacerbation and she received 2 g magnesium, Solu-Medrol , and a DuoNeb.  Patient is on nonrebreather on arrival.  Patient blood pressure also over 200 systolic and she is breathing very fast.  Patient is denying nausea, vomiting diarrhea or urinary changes.  Does report some constipation.  Reports pain in her chest and epigastric area that goes to her back.  She denies any history of this type of pain.  Denies trauma.  Reports environment does tend to trigger exacerbations and as she has been out of her medications and the rapid environmental temperature shifts I suspect this is the cause.  On my exam, lungs had poor air movement and increased work of breathing.  She has wheezing  throughout.   patient also had some faint rales and rhonchi but legs were nontender nonedematous.  Abdomen nontender.  Back not focally tender.  No CVA tenderness.  Chest was not focally tender.  I did not appreciate a murmur.  Patient is in respiratory distress.  Clinically I am concerned about respiratory distress from likely COPD exacerbation however flash pulm edema also consideration given the elevated blood pressure.  Will order blood pressure medicine and will also order BiPAP.  Patient may need continuous albuterol  as well and will have respiratory see the patient.  With the 9 out of 10 pain going from her chest and abdomen to her back that is sharp and the blood pressure over 200 in the setting of her missing blood pressure medication, will order dissection study.  Will check her for COVID/flu/RSV given the ongoing pandemic and get other labs.  Due to her ill appearance and presentation anticipate she will need admission.  10:29 AM Patient tells me she has had more mild edema in her legs on  and off recently this is different for her.  Her BNP is elevated over 700.  Initial troponin is normal, will trend.  She is negative for COVID/flu/RSV and her x-ray did not show clear pneumonia.  She is waiting on the CT results but she will need admission for hypoxia, potential new heart failure, severe hypertension potentially contributing to some flash pulmonary edema, and COPD exacerbation.  Anticipate admission after CT is completed.  10:56 AM CT scan does not show dissection nor does it show a large pneumonia.  It does show some nodules that may need outpatient imaging and workup but will plan for admission for the hypoxic respiratory failure in the setting of blood pressure elevation, BNP elevation, and COPD exacerbation.     Final diagnoses:  Hypoxia  COPD exacerbation (HCC)  Exacerbation of asthma, unspecified asthma severity, unspecified whether persistent  Shortness of breath  Acute cough  Elevated blood pressure reading  Elevated brain natriuretic peptide (BNP) level  Abnormal CT of the chest    Clinical Impression: 1. Hypoxia   2. COPD exacerbation (HCC)   3. Exacerbation of asthma, unspecified asthma severity, unspecified whether persistent   4. Shortness of breath   5. Acute cough   6. Elevated blood pressure reading   7. Elevated brain natriuretic peptide (BNP) level   8. Abnormal CT of the chest     Disposition: Admit  This note was prepared with assistance of Dragon voice recognition software. Occasional wrong-word or sound-a-like substitutions may have occurred due to the inherent limitations of voice recognition software.      Asencion Loveday, Lonni PARAS, MD 12/26/23 [phone]

## 2023-12-26 NOTE — Assessment & Plan Note (Signed)
 Most likely in the setting of recent illness and COPD exacerbation; management as above.

## 2023-12-26 NOTE — Progress Notes (Signed)
 Attempted echocardiogram at 2:52pm, pt not in room. Will try to reschedule tomorrow, 12/27/23.   Koleen Popper, RDCS Cardiovascular Sonographer

## 2023-12-26 NOTE — Plan of Care (Cosign Needed)
 [facility]MTS Brief Progress Note  S: Rita Hunter is a 53 y.o. female presenting with shortness of breath in the setting of recent URI and being out of all medications >1 week. She was admitted for COPD exacerbation. Patient c/o SOB on 4L [place]. Attempt to wean oxygen down to 2L while Oxygen sat remain high 90% pt c/o worsening SOB. SOB significantly improved once increase oxygen to 4L   O: BP 137/88 (BP Location: Left Arm)   Pulse 74   Temp 98.1 F (36.7 C) (Oral)   Resp 20   SpO2 99%   Physical Exam Cardiovascular:     Rate and Rhythm: Normal rate.  Pulmonary:     Breath sounds: Examination of the right-lower field reveals decreased breath sounds. Examination of the left-lower field reveals decreased breath sounds. Decreased breath sounds present.  Neurological:     Mental Status: She is oriented to person, place, and time.  Psychiatric:        Mood and Affect: Mood normal.        Behavior: Behavior normal.     Assessment & Plan COPD exacerbation (HCC) - Vital signs per floor - AM CBC, BMP  - Continue supplemental oxygen as necessary to keep O2 sats in goal range 88-92% - Restart home inhalers once med rec complete - Continue Atrovent  inhaler 2 puffs q6h - Continue azithromycin  500 mg x 3d - Continue Prednisone  40 mg x 5d  - Pending Echo to complete     Suzen Houston NOVAK, DO 12/26/2023, 8:27 PM PGY-1, [facility] Night Resident  Please page [phone] with questions.

## 2023-12-26 NOTE — Assessment & Plan Note (Addendum)
 Schizophrenia/Anxiety/Depression - restart home seroquel , zoloft  when med rec complete Social needs - SW consult placed for PCP, unhoused status, food insecurity, difficulty obtaining medications

## 2023-12-26 NOTE — Assessment & Plan Note (Signed)
-   Vital signs per floor - AM CBC, BMP  - Continue supplemental oxygen as necessary to keep O2 sats in goal range 88-92% - Restart home inhalers once med rec complete - Continue Atrovent  inhaler 2 puffs q6h - Continue azithromycin  500 mg x 3d - Continue Prednisone  40 mg x 5d  - Pending Echo to complete

## 2023-12-26 NOTE — H&P (Signed)
 Hospital Admission History and Physical Service Pager: [phone]  Patient name: Rita Hunter Medical record number: [id number] Date of Birth: [birth year] Age: 53 y.o. Gender: female  Primary Care Provider: Pcp, No Consultants: none Code Status: FULL which was confirmed with family if patient unable to confirm  Preferred Emergency Contact:  Contact Information     Name Relation Home Work Mobile   Hunter,Rita Sister [phone]        Other Contacts   None on File    Chief Complaint: shortness of breath  Assessment and Plan: Millisa Giarrusso is a 53 y.o. female presenting with shortness of breath in the setting of recent URI and being out of all medications >1 week. Differential for presentation of this includes   - COPD exacerbation most likely given history of COPD, recent URI and being out of inhalers, particularly given improvement in respirations following albuterol , Atrovent , etc. - new onset heart failure considered given patient report of new leg swelling as well as elevated BNP in ED. - PNA less likely without fevers, more significant leukocytosis. Additionally no evidence of PNA on chest imaging obtained in ED.  Assessment & Plan COPD exacerbation (HCC) Respiratory status worsening over the last several days. Patient does have productive cough. Has not been using inhalers. - Admit to FMTS, attending Dr. McDiarmid - Med tele, Vital signs per floor - PT/OT to treat - AM CBC, BMP  - Continue supplemental oxygen as necessary to keep O2 sats in goal range 88-92% - Restart home inhalers once med rec complete - Atrovent  inhaler 2 puffs q6h - initiate azithromycin  500 mg x 3d - Prednisone  40 mg x 5d  - Echo Acute hypoxic respiratory failure (HCC) Most likely in the setting of recent illness and COPD exacerbation; management as above. Multiple lung nodules Incidental finding on CT in ED. Multiple bilateral pulmonary nodules measuring up to 11 mm. - close follow up  outpatient with PET-CT for further evaluation Chronic health problem Schizophrenia/Anxiety/Depression - restart home seroquel , zoloft  when med rec complete Social needs - SW consult placed for PCP, unhoused status, food insecurity, difficulty obtaining medications   FEN/GI: regular diet VTE Prophylaxis: Lovenox  Disposition: med tele  History of Present Illness:  Rita Hunter is a 53 y.o. female presenting with 2-3 days of worsening shortness of breath. Last night she says she could not sleep because she was having so much trouble breathing; finally this AM she called EMS and was brought to the ED. She shares that over the last week or so she has been out of all medications due to issues with social security. She also had a cold 1 week ago with sore throat, runny nose, coughing, sneezing. No fevers. Now she has severe shortness of breath, she can only walk 1-2 steps before she has to stop. She has productive cough, difficulty with deep breaths. She shares that season changes have historically triggered COPD exacerbations and she feels like this is similar. She is also very hungry and has not been staying well hydrated. No nausea or vomiting, but has had some new constipation in the last week or so associated with mild LLQ belly pain.  Patient also shares that she has noted swelling to both of her ankles in the last month, seems to come and go.  Patient received albuterol , Atrovent , solumedrol, and mag from EMS en route to ED. In the ED, patient underwent imaging and labs significant for elevated BNP to 705, mild leukocytosis; CXR without acute abnormality, CT  specifically without aortic dissection or any other acute abnormality. She was given labetalol for high pressures and ativan for anxiety related to hypoxia/work of breathing.  Review Of Systems: Per HPI.  Pertinent Past Medical History: Schizophrenia PTSD Depression Degenerative disc disease Irregular heartbeat HTN COPD Remainder  reviewed in history tab.   Pertinent Past Surgical History: 2014 and 2015 back surgeries Right shoulder surgery 3 hernia repairs  Remainder reviewed in history tab.   Pertinent Social History: Tobacco use: Yes - now 1-2 cigarettes daily; more than 20 years Alcohol use: occasional Other Substance use: occasional marijuana Homeless, stays on Uncle's property in a broken down fleeta  Pertinent Family History: Mom, Grandmother died of MI Dad COPD Remainder reviewed in history tab.   Important Outpatient Medications: At this time home med rec is not complete; medications listed below per patient recollection, she does not know dosages/frequencies. Metoprolol  Gabapentin  Seroquel  Zoloft  Naproxen Tizanidine  Spiriva  Advair  Albuterol  Remainder reviewed in medication history.   Objective: BP 135/77   Pulse 70   Temp (!) 97.3 F (36.3 C) (Oral)   Resp 20   SpO2 96%  Exam: General: chronically ill-appearing, quite thin, no acute distress. HEENT: normocephalic, PERRLA, EOM grossly intact, MMM. No LAD. Cardio: Regular rate, regular rhythm, no murmurs on exam. Pulm: Significant expiratory wheeze bilaterally, deep breaths triggering coughing episodes. Mildly increased work of breathing on 2L O2 via [place]. Abdominal: bowel sounds present, soft, mildly tender in LLQ, non-distended. No HSM. No CVA tenderness. Extremities: no peripheral edema. Moves all extremities equally. Neuro: Alert and oriented x3, speech normal in content, no facial asymmetry. Psych:  Cognition and judgment appear intact. Alert, communicative, and cooperative with normal attention span and concentration. No apparent delusions, illusions, hallucinations.    Labs:  CBC BMET  Recent Labs  Lab 12/26/23 0808  WBC 12.7*  HGB 14.8  HCT 45.6  PLT 264   Recent Labs  Lab 12/26/23 0808  NA 139  K 3.6  CL 102  CO2 26  BUN 9  CREATININE 0.71  GLUCOSE 135*  CALCIUM 8.5*    BNP 705  EKG: Sinus rhythm with p-waves  present, no evidence of ST elevation, no QT prolongation.  Imaging Studies Performed: 12/26/23 CXR: IMPRESSION: 1. Emphysema. No acute cardiopulmonary abnormality.  12/26/23 CT Angio C/A/P: IMPRESSION: 1. No evidence for acute intramural hematoma or dissection of the thoracoabdominal aorta. No thoracoabdominal aortic aneurysm. 2. Multiple bilateral pulmonary nodules measuring up to 11 mm and warranting close follow-up to exclude neoplasm. Given dominant nodules measuring in the 10-11 mm size range, PET-CT may prove helpful to further evaluate. 3. No acute findings in the abdomen or pelvis.  I have personally reviewed this imaging and agree with the interpretation as above.  Lafe Domino, DO 12/26/2023, 12:51 PM PGY-2, [facility]  FPTS Intern pager: [phone], text pages welcome Secure chat group [facility] Teaching Service

## 2023-12-26 NOTE — Assessment & Plan Note (Signed)
 Incidental finding on CT in ED. Multiple bilateral pulmonary nodules measuring up to 11 mm. - close follow up outpatient with PET-CT for further evaluation

## 2023-12-26 NOTE — Progress Notes (Signed)
 Pharmacy notified x 2 for missing inhaler meds.  Currently no distress noted, pt appears comfortable.

## 2023-12-26 NOTE — ED Notes (Signed)
Pt given sandwich bag and coke.  

## 2023-12-26 NOTE — Assessment & Plan Note (Signed)
 Respiratory status worsening over the last several days. Patient does have productive cough. Has not been using inhalers. - Admit to FMTS, attending Dr. McDiarmid - Med tele, Vital signs per floor - PT/OT to treat - AM CBC, BMP  - Continue supplemental oxygen as necessary to keep O2 sats in goal range 88-92% - Restart home inhalers once med rec complete - Atrovent  inhaler 2 puffs q6h - initiate azithromycin  500 mg x 3d - Prednisone  40 mg x 5d  - Echo

## 2023-12-26 NOTE — ED Triage Notes (Signed)
 Pt also reports having schizophrenia, she is supposed to be taking 200mg  seroquil but has not taken it for a week.

## 2023-12-27 ENCOUNTER — Inpatient Hospital Stay (HOSPITAL_COMMUNITY)

## 2023-12-27 DIAGNOSIS — M62838 Other muscle spasm: Secondary | ICD-10-CM | POA: Insufficient documentation

## 2023-12-27 DIAGNOSIS — J9601 Acute respiratory failure with hypoxia: Secondary | ICD-10-CM | POA: Diagnosis not present

## 2023-12-27 DIAGNOSIS — J441 Chronic obstructive pulmonary disease with (acute) exacerbation: Secondary | ICD-10-CM | POA: Diagnosis not present

## 2023-12-27 DIAGNOSIS — R0609 Other forms of dyspnea: Secondary | ICD-10-CM

## 2023-12-27 LAB — ECHOCARDIOGRAM COMPLETE
AR max vel: 2.89 cm2
AV Area VTI: 3.21 cm2
AV Area mean vel: 2.87 cm2
AV Mean grad: 3 mmHg
AV Peak grad: 6.8 mmHg
Ao pk vel: 1.3 m/s
Area-P 1/2: 4.04 cm2
Calc EF: 60 %
Height: 68 in
MV VTI: 3.32 cm2
S' Lateral: 3.3 cm
Single Plane A2C EF: 60 %
Single Plane A4C EF: 59.1 %
Weight: 1950.63 [oz_av]

## 2023-12-27 LAB — CBC
HCT: 40 % (ref 36.0–46.0)
Hemoglobin: 13 g/dL (ref 12.0–15.0)
MCH: 30.4 pg (ref 26.0–34.0)
MCHC: 32.5 g/dL (ref 30.0–36.0)
MCV: 93.5 fL (ref 80.0–100.0)
Platelets: 212 K/uL (ref 150–400)
RBC: 4.28 MIL/uL (ref 3.87–5.11)
RDW: 12.9 % (ref 11.5–15.5)
WBC: 9.5 K/uL (ref 4.0–10.5)
nRBC: 0 % (ref 0.0–0.2)

## 2023-12-27 LAB — BASIC METABOLIC PANEL WITH GFR
Anion gap: 11 (ref 5–15)
BUN: 27 mg/dL — ABNORMAL HIGH (ref 6–20)
CO2: 24 mmol/L (ref 22–32)
Calcium: 8.8 mg/dL — ABNORMAL LOW (ref 8.9–10.3)
Chloride: 104 mmol/L (ref 98–111)
Creatinine, Ser: 0.98 mg/dL (ref 0.44–1.00)
GFR, Estimated: >60 mL/min (ref >60)
Glucose, Bld: 138 mg/dL — ABNORMAL HIGH (ref 70–99)
Potassium: 4.1 mmol/L (ref 3.5–5.1)
Sodium: 139 mmol/L (ref 135–145)

## 2023-12-27 MED ORDER — SALINE SPRAY 0.65 % NA SOLN
1.0000 | NASAL | Status: DC | PRN
  Administered 2023-12-30: 1 via NASAL
  Filled 2023-12-27: qty 44

## 2023-12-27 MED ORDER — OXYCODONE HCL 5 MG PO TABS
5.0000 mg | ORAL_TABLET | Freq: Four times a day (QID) | ORAL | Status: DC | PRN
  Administered 2023-12-27 – 2023-12-28 (×4): 5 mg via ORAL
  Filled 2023-12-27 (×4): qty 1

## 2023-12-27 MED ORDER — GABAPENTIN 100 MG PO CAPS
600.0000 mg | ORAL_CAPSULE | Freq: Three times a day (TID) | ORAL | Status: DC
  Administered 2023-12-27 – 2023-12-31 (×12): 600 mg via ORAL
  Filled 2023-12-27 (×12): qty 6

## 2023-12-27 MED ORDER — ALBUTEROL SULFATE (2.5 MG/3ML) 0.083% IN NEBU
2.5000 mg | INHALATION_SOLUTION | RESPIRATORY_TRACT | Status: DC | PRN
  Administered 2023-12-28: 2.5 mg via RESPIRATORY_TRACT
  Filled 2023-12-27: qty 3

## 2023-12-27 MED ORDER — ACETAMINOPHEN 325 MG PO TABS
650.0000 mg | ORAL_TABLET | Freq: Four times a day (QID) | ORAL | Status: DC
  Administered 2023-12-27 (×2): 325 mg via ORAL
  Administered 2023-12-27 – 2023-12-31 (×15): 650 mg via ORAL
  Filled 2023-12-27 (×17): qty 2

## 2023-12-27 MED ORDER — SERTRALINE HCL 100 MG PO TABS
100.0000 mg | ORAL_TABLET | Freq: Every day | ORAL | Status: DC
  Administered 2023-12-27 – 2023-12-31 (×5): 100 mg via ORAL
  Filled 2023-12-27 (×5): qty 1

## 2023-12-27 MED ORDER — TIZANIDINE HCL 4 MG PO TABS
2.0000 mg | ORAL_TABLET | Freq: Three times a day (TID) | ORAL | Status: DC | PRN
  Administered 2023-12-27 – 2023-12-28 (×2): 2 mg via ORAL
  Filled 2023-12-27 (×2): qty 1

## 2023-12-27 MED ORDER — IPRATROPIUM-ALBUTEROL 0.5-2.5 (3) MG/3ML IN SOLN
3.0000 mL | Freq: Three times a day (TID) | RESPIRATORY_TRACT | Status: DC
  Administered 2023-12-27 – 2023-12-29 (×5): 3 mL via RESPIRATORY_TRACT
  Filled 2023-12-27 (×5): qty 3

## 2023-12-27 MED ORDER — AZITHROMYCIN 250 MG PO TABS
500.0000 mg | ORAL_TABLET | Freq: Every day | ORAL | Status: AC
  Administered 2023-12-27 – 2023-12-28 (×2): 500 mg via ORAL
  Filled 2023-12-27 (×2): qty 2

## 2023-12-27 MED ORDER — LIDOCAINE 5 % EX PTCH
2.0000 | MEDICATED_PATCH | CUTANEOUS | Status: DC
  Administered 2023-12-27 – 2023-12-30 (×4): 2 via TRANSDERMAL
  Filled 2023-12-27 (×4): qty 2

## 2023-12-27 NOTE — Assessment & Plan Note (Signed)
 Schizophrenia/Anxiety/Depression - restart home seroquel , zoloft  when med rec complete Social needs - SW consult placed for PCP, unhoused status, food insecurity, difficulty obtaining medications

## 2023-12-27 NOTE — Progress Notes (Signed)
 Daily Progress Note Intern Pager: [phone]  Patient name: Rita Hunter Medical record number: [id number] Date of birth: 07-14-1970 Age: 53 y.o. Gender: female  Primary Care Provider: Pcp, No Consultants: None Code Status: Full  Pt Overview and Major Events to Date:  11/14: Admitted for COPD exacerbation in the setting of missed medications x1 week  Assessment and Plan:  Rita Hunter is a 53 y.o. female w/PMHx of COPD presenting with shortness of breath in the setting of recent URI and being out of all medications >1 week.  Assessment & Plan COPD exacerbation (HCC) Acute hypoxic respiratory failure (HCC) - AM CBC, BMP  - Continue supplemental oxygen as necessary to keep O2 sats in goal range 88-92%; wean as tolerated - Restart home inhalers once med rec complete - Continue Atrovent  inhaler 2 puffs q6h - Continue azithromycin  500 mg x 3d - Continue Prednisone  40 mg x 5d  - Echo pending - PT/OT eval, appreciate recs Muscle spasm Back and L chest. Chronic back, chest x1 week since SOB symptoms started. No different than normal, relieved with oxy 5mg  overnight. Has taken tizanidine  in the past, no refills seen in the last 12 months on [facility]. Reproducible pain on exam. Low suspicion for ACS with chronicity, no change in pain character, reproducible pain on exam, and relief with pain medication. - Pain control with scheduled Tylenol  650mg  q6h for mild pain, oxycodone 5mg  q6h for moderate pain, tizanidine  2mg  TID PRN for muscle spasms Multiple lung nodules Incidental finding on CT in ED. Multiple bilateral pulmonary nodules measuring up to 11 mm. - Close follow up outpatient with PET-CT for further evaluation Chronic health problem Schizophrenia/Anxiety/Depression: restart home seroquel , zoloft  when med rec complete Social needs: SW consult placed for PCP, unhoused status, food insecurity, difficulty obtaining medications  Complaints of back pain overnight, given 5mg  oxy q6h  PRN VSS overnight on 3.5, I put her down to 2.5, satting 94% Today, uncomfortable appearing due to chest and back spasms Diminished breath sounds, poor respiratory effort, reproducible L chest and back pain  BMP and CBC unremarkable today   FEN/GI: Regular diet PPx: Lovenox Dispo:Home pending clinical improvement .   Subjective:  Patient was seen and evaluated at bedside.   Objective: Temp:  [97.3 F (36.3 C)-98.7 F (37.1 C)] 97.7 F (36.5 C) (11/15 0416) Pulse Rate:  [60-86] 85 (11/15 0416) Resp:  [14-27] 17 (11/15 0416) BP: (114-212)/(64-122) 116/78 (11/15 0416) SpO2:  [94 %-100 %] 99 % (11/15 0416) FiO2 (%):  [50 %] 50 % (11/14 0829) Weight:  [55.3 kg] 55.3 kg (11/14 2200) Physical Exam: General: uncomfortable appearing female lying in hospital bed in no acute distress Cardiovascular: RRR, no m/g/r, 2+ radial pulses Respiratory: slightly tachypneic on 3L O2 satting 94%, diminished breath sounds, poor inspiratory effort 2/2 muscle pain Abdomen: non-distended Extremities: no peripheral edema, moves all extremities equally  Laboratory: Most recent CBC Lab Results  Component Value Date   WBC 9.5 12/27/2023   HGB 13.0 12/27/2023   HCT 40.0 12/27/2023   MCV 93.5 12/27/2023   PLT 212 12/27/2023   Most recent BMP    Latest Ref Rng & Units 12/27/2023    5:53 AM  BMP  Glucose 70 - 99 mg/dL 861   BUN 6 - 20 mg/dL 27   Creatinine 9.55 - 1.00 mg/dL 9.01   Sodium 864 - 854 mmol/L 139   Potassium 3.5 - 5.1 mmol/L 4.1   Chloride 98 - 111 mmol/L 104   CO2 22 -  32 mmol/L 24   Calcium 8.9 - 10.3 mg/dL 8.8    Lupie Credit, DO 12/27/2023, 7:33 AM  PGY-1, [facility] FPTS Intern pager: [phone], text pages welcome Secure chat group [facility] [place] Teaching Service

## 2023-12-27 NOTE — Assessment & Plan Note (Addendum)
 Back and L chest. Chronic back, chest x1 week since SOB symptoms started. No different than normal, relieved with oxy 5mg  overnight. Has taken tizanidine  in the past, no refills seen in the last 12 months on [facility]. Reproducible pain on exam. Low suspicion for ACS with chronicity, no change in pain character, reproducible pain on exam, and relief with pain medication. - Pain control with scheduled Tylenol  650mg  q6h for mild pain, oxycodone 5mg  q6h for moderate pain, tizanidine  2mg  TID PRN for muscle spasms

## 2023-12-27 NOTE — Assessment & Plan Note (Addendum)
 Incidental finding on CT in ED. Multiple bilateral pulmonary nodules measuring up to 11 mm. - Close follow up outpatient with PET-CT for further evaluation

## 2023-12-27 NOTE — Discharge Instructions (Signed)

## 2023-12-27 NOTE — Evaluation (Signed)
 Physical Therapy Evaluation Patient Details Name: Rita Hunter MRN: [id number] DOB: [birth year] Today's Date: 12/27/2023  History of Present Illness  Pt is a 53 y.o female admitted 11/14 for COPD exacerbation. PMH: COPD, depression, HTN, schizophrenia  Clinical Impression  Pt admitted with above diagnosis. Previously independent without difficulties. Lives in a [place] on her uncles land and has access to his home for ADL care. She was able to ambulate with supervision but demonstrates mild instability, with good awareness and ability to compensate. Agreeable to use SPC if we provide one. SpO2 down to 88% with moderate dyspnea towards end of 48' distance but demonstrated poor waveform and rapidly rose upon sitting up to 98%, all while on 3L supplemental O2. Would continue to check for accurate results. Pt currently with functional limitations due to the deficits listed below (see PT Problem List). Pt will benefit from acute skilled PT to increase their independence and safety with mobility to allow discharge.            If plan is discharge home, recommend the following: Assist for transportation   Can travel by private vehicle        Equipment Recommendations Cane  Recommendations for Other Services       Functional Status Assessment Patient has had a recent decline in their functional status and demonstrates the ability to make significant improvements in function in a reasonable and predictable amount of time.     Precautions / Restrictions Precautions Precautions: Fall Recall of Precautions/Restrictions: Intact Precaution/Restrictions Comments: monitor O2 Restrictions Weight Bearing Restrictions Per Provider Order: No      Mobility  Bed Mobility Overal bed mobility: Independent                  Transfers Overall transfer level: Modified independent Equipment used: None               General transfer comment: minimal sway, no assist required.     Ambulation/Gait Ambulation/Gait assistance: Supervision Gait Distance (Feet): 70 Feet Assistive device: None Gait Pattern/deviations: Step-through pattern, Decreased stride length, Narrow base of support, Drifts right/left Gait velocity: dec Gait velocity interpretation: <1.31 ft/sec, indicative of household ambulator   General Gait Details: Freqeunt drifting but able to self correct, cues for awareness, wider BOS, and pacing for energy conservation. Moderate dyspnea. SpO2 nadir at 88% on 3L supplemental O2.  Stairs            Wheelchair Mobility     Tilt Bed    Modified Rankin (Stroke Patients Only)       Balance Overall balance assessment: Mild deficits observed, not formally tested                                           Pertinent Vitals/Pain Pain Assessment Pain Assessment: No/denies pain    Home Living Family/patient expects to be discharged to:: Shelter/Homeless Rita Hunter)                   Additional Comments: Lives alone in a [place] on her uncles land here in town. Has access to use his home for things.    Prior Function Prior Level of Function : Independent/Modified Independent;Driving;History of Falls (last six months)             Mobility Comments: ind ADLs Comments: ind - goes to uncles house here in [place] to perfrom ADLs  Extremity/Trunk Assessment   Upper Extremity Assessment Upper Extremity Assessment: Defer to OT evaluation;Right hand dominant    Lower Extremity Assessment Lower Extremity Assessment: Overall WFL for tasks assessed       Communication   Communication Communication: No apparent difficulties    Cognition Arousal: Alert Behavior During Therapy: WFL for tasks assessed/performed   PT - Cognitive impairments: No apparent impairments                         Following commands: Intact       Cueing Cueing Techniques: Verbal cues     General Comments General comments (skin  integrity, edema, etc.): SpO2 lowest at 88% on 3L while ambulating however waveform inconsistent. Increaased to up 98% rapidly upon sitting with stronger waveform while still on 3L.    Exercises     Assessment/Plan    PT Assessment Patient needs continued PT services  PT Problem List Decreased activity tolerance;Decreased balance;Decreased mobility;Decreased knowledge of use of DME;Cardiopulmonary status limiting activity       PT Treatment Interventions DME instruction;Gait training;Functional mobility training;Therapeutic activities;Therapeutic exercise;Balance training;Neuromuscular re-education;Patient/family education    PT Goals (Current goals can be found in the Care Plan section)  Acute Rehab PT Goals Patient Stated Goal: feel better PT Goal Formulation: With patient Time For Goal Achievement: 01/03/24 Potential to Achieve Goals: Good    Frequency Min 2X/week     Co-evaluation               AM-PAC PT 6 Clicks Mobility  Outcome Measure Help needed turning from your back to your side while in a flat bed without using bedrails?: None Help needed moving from lying on your back to sitting on the side of a flat bed without using bedrails?: None Help needed moving to and from a bed to a chair (including a wheelchair)?: None Help needed standing up from a chair using your arms (e.g., wheelchair or bedside chair)?: None Help needed to walk in hospital room?: A Little Help needed climbing 3-5 steps with a railing? : A Little 6 Click Score: 22    End of Session Equipment Utilized During Treatment: Oxygen Activity Tolerance: Patient tolerated treatment well Patient left: in bed;with call bell/phone within reach   PT Visit Diagnosis: Unsteadiness on feet (R26.81);Other abnormalities of gait and mobility (R26.89);Difficulty in walking, not elsewhere classified (R26.2)    Time: 9248-9192 PT Time Calculation (min) (ACUTE ONLY): 16 min   Charges:   PT Evaluation $PT  Eval Low Complexity: 1 Low   PT General Charges $$ ACUTE PT VISIT: 1 Visit         Leontine Roads, PT, DPT [facility]  Rehabilitation Services Physical Therapist Office: [phone] Website: [email or web address]   Leontine GORMAN Roads 12/27/2023, 8:56 AM

## 2023-12-27 NOTE — Assessment & Plan Note (Addendum)
-   AM CBC, BMP  - Continue supplemental oxygen as necessary to keep O2 sats in goal range 88-92%; wean as tolerated - Restart home inhalers once med rec complete - Continue Atrovent  inhaler 2 puffs q6h - Continue azithromycin  500 mg x 3d - Continue Prednisone  40 mg x 5d  - Echo pending - PT/OT eval, appreciate recs

## 2023-12-27 NOTE — Evaluation (Signed)
 Occupational Therapy Evaluation Patient Details Name: Rita Hunter MRN: [id number] DOB: 11-12-1970 Today's Date: 12/27/2023   History of Present Illness   Pt is a 53 y.o female admitted 11/14 for COPD exacerbation. PMH: COPD, depression, HTN, schizophrenia     Clinical Impressions Pt admitted based on above, and was seen based on problem list below. PTA pt was independent with ADLs and IADLs. Today pt is at supervision level for ADLs. Functional transfers and mobility is  s for safety. Pt limited by decreased activity tolerance; tolerating <5 minutes of standing acitvity. Began verbally educating pt on use of energy conservation strategies for ADLs, but pt would benefit from further education. No follow up OT or DME needs. OT will continue to follow acutely to maximize functional independence.    If plan is discharge home, recommend the following:   Assistance with cooking/housework;Assist for transportation     Functional Status Assessment   Patient has had a recent decline in their functional status and demonstrates the ability to make significant improvements in function in a reasonable and predictable amount of time.     Equipment Recommendations   None recommended by OT      Precautions/Restrictions   Precautions Precautions: Fall Recall of Precautions/Restrictions: Intact Precaution/Restrictions Comments: monitor O2 Restrictions Weight Bearing Restrictions Per Provider Order: No     Mobility Bed Mobility Overal bed mobility: Independent      Transfers Overall transfer level: Modified independent Equipment used: None   General transfer comment: No assist for STS, supervision for mobility      Balance Overall balance assessment: Mild deficits observed, not formally tested           ADL either performed or assessed with clinical judgement   ADL Overall ADL's : Needs assistance/impaired Eating/Feeding: Set up;Sitting   Grooming:  Supervision/safety;Standing;Oral care;Wash/dry face           Upper Body Dressing : Set up;Sitting   Lower Body Dressing: Supervision/safety;Sit to/from stand   Toilet Transfer: Supervision/safety;Ambulation   Toileting- Clothing Manipulation and Hygiene: Supervision/safety;Sit to/from stand       Functional mobility during ADLs: Supervision/safety General ADL Comments: Limited by decreased activity tolerance. <5 minutes of standing activity     Vision Baseline Vision/History: 0 No visual deficits Vision Assessment?: No apparent visual deficits            Pertinent Vitals/Pain Pain Assessment Pain Assessment: No/denies pain     Extremity/Trunk Assessment Upper Extremity Assessment Upper Extremity Assessment: Overall WFL for tasks assessed   Lower Extremity Assessment Lower Extremity Assessment: Defer to PT evaluation   Cervical / Trunk Assessment Cervical / Trunk Assessment: Normal   Communication Communication Communication: No apparent difficulties   Cognition Arousal: Alert Behavior During Therapy: WFL for tasks assessed/performed Cognition: No apparent impairments     Following commands: Intact       Cueing  General Comments   Cueing Techniques: Verbal cues  Received on 1L, session conducted on 1L lowest reading 94% with activity. Left on RA at end of session, 92%           Home Living Family/patient expects to be discharged to:: Shelter/Homeless Rita Hunter)     Additional Comments: Lives alone in a [place] on her uncles land here in town. Has access to use his home for things.      Prior Functioning/Environment Prior Level of Function : Independent/Modified Independent;Driving;History of Falls (last six months)             Mobility Comments: ind  ADLs Comments: ind - goes to uncles house here to perfrom ADLs    OT Problem List: Decreased activity tolerance;Cardiopulmonary status limiting activity   OT Treatment/Interventions: Self-care/ADL  training;Therapeutic exercise;Energy conservation;DME and/or AE instruction;Therapeutic activities;Patient/family education;Balance training      OT Goals(Current goals can be found in the care plan section)   Acute Rehab OT Goals Patient Stated Goal: To take a shower OT Goal Formulation: With patient Time For Goal Achievement: 01/10/24 Potential to Achieve Goals: Good   OT Frequency:  Min 2X/week       AM-PAC OT 6 Clicks Daily Activity     Outcome Measure Help from another person eating meals?: None Help from another person taking care of personal grooming?: A Little Help from another person toileting, which includes using toliet, bedpan, or urinal?: A Little Help from another person bathing (including washing, rinsing, drying)?: A Little Help from another person to put on and taking off regular upper body clothing?: A Little Help from another person to put on and taking off regular lower body clothing?: A Little 6 Click Score: 19   End of Session Equipment Utilized During Treatment: Oxygen Nurse Communication: Mobility status;Other (comment) (Request to shower)  Activity Tolerance: Patient limited by fatigue Patient left: in bed;with call bell/phone within reach  OT Visit Diagnosis: Unsteadiness on feet (R26.81);Other abnormalities of gait and mobility (R26.89);Muscle weakness (generalized) (M62.81)                Time: 8547-8494 OT Time Calculation (min): 13 min Charges:  OT General Charges $OT Visit: 1 Visit OT Evaluation $OT Eval Low Complexity: 1 Low  Rita Hunter, OT  Acute Rehabilitation Services Office [phone] Secure chat preferred   Rita Hunter Savers 12/27/2023, 3:51 PM

## 2023-12-27 NOTE — Plan of Care (Signed)
  Problem: Nutrition: Goal: Adequate nutrition will be maintained Outcome: Completed/Met   Problem: Elimination: Goal: Will not experience complications related to bowel motility Outcome: Completed/Met Goal: Will not experience complications related to urinary retention Outcome: Completed/Met   Problem: Safety: Goal: Ability to remain free from injury will improve Outcome: Completed/Met   Problem: Skin Integrity: Goal: Risk for impaired skin integrity will decrease Outcome: Completed/Met

## 2023-12-27 NOTE — Progress Notes (Signed)
  Echocardiogram 2D Echocardiogram has been performed.  [name] 12/27/2023, 3:16 PM

## 2023-12-27 NOTE — Hospital Course (Signed)
 Rita Hunter is a 53 y.o.female with a history of COPD who was admitted to the [facility] Medicine Teaching Service at [facility] for COPD exacerbation. Her hospital course is detailed below:  COPD exacerbation  Patient presented with productive cough and worsening dyspnea found to have acute hypoxic respiratory failure. CXR with emphysema, no acute abnormality. CTA CAP  Patient started on azithromycin  course (11/14-11/16) and prednisone  course (11/14-11/18).   PT/OT evaluated patient and recommended ***  By time of discharge, patient satting well on ***  Other chronic conditions were medically managed with home medications and formulary alternatives as necessary (Schizophrenia/Anxiety/Depression)  PCP Follow-up Recommendations: CT/PET Scan outpatient to follow-up lung nodules. Referral to pain management. Follow-up alpha-1-antitrypsin

## 2023-12-28 DIAGNOSIS — J441 Chronic obstructive pulmonary disease with (acute) exacerbation: Secondary | ICD-10-CM | POA: Diagnosis not present

## 2023-12-28 MED ORDER — OXYCODONE HCL 5 MG PO TABS
5.0000 mg | ORAL_TABLET | Freq: Three times a day (TID) | ORAL | Status: DC | PRN
  Administered 2023-12-29 – 2023-12-30 (×4): 5 mg via ORAL
  Filled 2023-12-28 (×4): qty 1

## 2023-12-28 MED ORDER — OXYCODONE HCL 5 MG PO TABS
5.0000 mg | ORAL_TABLET | Freq: Three times a day (TID) | ORAL | Status: DC | PRN
  Administered 2023-12-28: 5 mg via ORAL
  Filled 2023-12-28: qty 1

## 2023-12-28 NOTE — Assessment & Plan Note (Signed)
-   Wean to RA, goal O2 88-92% - Anoro Ellipta 1 puff daily for maintenance  - DuoNeb TID  - Albuterol  2.5 mg neb q4h PRN  - Azithromycin  500 mg (11/15-11/17) - Continue Prednisone  40 mg (11/15-11/19) - Ambulatory O2 once weaned to RA

## 2023-12-28 NOTE — Assessment & Plan Note (Signed)
 Chronic. Continues to be difficult to control.  - Tylenol  650mg  q6h for mild pain - Oxycodone 5mg  q6h for moderate pain, wean as able  - Tizanidine  2mg  q8h PRN for muscle spasms - Gabapentin  600 mg TID - Lidocaine Patch q24h  - Heating pad

## 2023-12-28 NOTE — Assessment & Plan Note (Signed)
 Schizophrenia/Anxiety/Depression: restart home seroquel , zoloft  when med rec complete Social needs: SW consult placed for PCP, unhoused status, food insecurity, difficulty obtaining medications Lung Nodules on CT: follow up outpatient with PET-CT

## 2023-12-28 NOTE — Progress Notes (Signed)
     Daily Progress Note Intern Pager: [phone]  Patient name: Rita Hunter Medical record number: [id number] Date of birth: 09/11/1970 Age: 53 y.o. Gender: female  Primary Care Provider: Pcp, No Consultants: None  Code Status: Full code   Pt Overview and Major Events to Date:  11/14: Admitted   Medical Decision Making:  Rita Hunter is a 53 y.o. female admitted for COPD exacerbation. Continues to require 1-2 L O2. Plan to wean as tolerated today. Will need ambulatory pulse ox before deciding discharge.   Chronic back and chest pain continues to be difficult to control. Will add on adjunctive therapy for pain management. Patient is not a good candidate to be started on chronic opioids inpatient. Will need to establish care with pain management outpatient and wean opioids as tolerated.  Assessment & Plan COPD exacerbation (HCC) Acute hypoxic respiratory failure (HCC) - Wean to RA, goal O2 88-92% - Anoro Ellipta 1 puff daily for maintenance  - DuoNeb TID  - Albuterol  2.5 mg neb q4h PRN  - Azithromycin  500 mg (11/15-11/17) - Continue Prednisone  40 mg (11/15-11/19) - Ambulatory O2 once weaned to RA  Muscle spasm Chronic. Continues to be difficult to control.  - Tylenol  650mg  q6h for mild pain - Oxycodone 5mg  q6h for moderate pain, wean as able  - Tizanidine  2mg  q8h PRN for muscle spasms - Gabapentin  600 mg TID - Lidocaine Patch q24h  - Heating pad  Chronic health problem Schizophrenia/Anxiety/Depression: restart home seroquel , zoloft  when med rec complete Social needs: SW consult placed for PCP, unhoused status, food insecurity, difficulty obtaining medications Lung Nodules on CT: follow up outpatient with PET-CT   FEN/GI: Regular  PPx: Lovenox Dispo:Home pending clinical improvement . Barriers include ongoing need for O2, symptomatic control.   Subjective:  Awake, reports some improvement in breathing. Still feels like she needs O2.   Objective: Temp:  [98.2 F (36.8  C)-98.7 F (37.1 C)] 98.6 F (37 C) (11/16 0836) Pulse Rate:  [66-88] 88 (11/16 0836) Resp:  [16-19] 18 (11/16 0836) BP: (122-166)/(59-97) 150/72 (11/16 0836) SpO2:  [91 %-98 %] 93 % (11/16 0836) FiO2 (%):  [24 %] 24 % (11/15 1952) Physical Exam: General: Chronically ill-appearing, no acute distress Cardio: RRR, no murmur on exam Pulm: tight air movement bilaterally with expiratory wheezing diffusely, No increased work of breathing Abdomen: Soft, bowel sounds present, nontender Extremity: No peripheral edema   Laboratory: Most recent CBC Lab Results  Component Value Date   WBC 9.5 12/27/2023   HGB 13.0 12/27/2023   HCT 40.0 12/27/2023   MCV 93.5 12/27/2023   PLT 212 12/27/2023   Most recent BMP    Latest Ref Rng & Units 12/27/2023    5:53 AM  BMP  Glucose 70 - 99 mg/dL 861   BUN 6 - 20 mg/dL 27   Creatinine 9.55 - 1.00 mg/dL 9.01   Sodium 864 - 854 mmol/L 139   Potassium 3.5 - 5.1 mmol/L 4.1   Chloride 98 - 111 mmol/L 104   CO2 22 - 32 mmol/L 24   Calcium 8.9 - 10.3 mg/dL 8.8    Imaging/Diagnostic Tests: TTE: EF 55-60%, normal LV function, RVSP 57 mmHg, normal valves   Cleotilde Perkins, DO 12/28/2023, 8:45 AM  PGY-3, [place] [facility] FPTS Intern pager: [phone], text pages welcome Secure chat group [facility] Teaching Service

## 2023-12-29 ENCOUNTER — Other Ambulatory Visit (HOSPITAL_COMMUNITY): Payer: Self-pay

## 2023-12-29 ENCOUNTER — Telehealth (HOSPITAL_COMMUNITY): Payer: Self-pay

## 2023-12-29 ENCOUNTER — Inpatient Hospital Stay (HOSPITAL_COMMUNITY)

## 2023-12-29 DIAGNOSIS — J439 Emphysema, unspecified: Secondary | ICD-10-CM | POA: Diagnosis not present

## 2023-12-29 DIAGNOSIS — J9601 Acute respiratory failure with hypoxia: Secondary | ICD-10-CM | POA: Diagnosis not present

## 2023-12-29 DIAGNOSIS — I272 Pulmonary hypertension, unspecified: Secondary | ICD-10-CM | POA: Diagnosis not present

## 2023-12-29 DIAGNOSIS — Z5902 Unsheltered homelessness: Secondary | ICD-10-CM | POA: Diagnosis not present

## 2023-12-29 DIAGNOSIS — I5030 Unspecified diastolic (congestive) heart failure: Secondary | ICD-10-CM

## 2023-12-29 DIAGNOSIS — F17210 Nicotine dependence, cigarettes, uncomplicated: Secondary | ICD-10-CM | POA: Diagnosis not present

## 2023-12-29 DIAGNOSIS — S42001A Fracture of unspecified part of right clavicle, initial encounter for closed fracture: Secondary | ICD-10-CM | POA: Diagnosis not present

## 2023-12-29 DIAGNOSIS — R918 Other nonspecific abnormal finding of lung field: Secondary | ICD-10-CM

## 2023-12-29 DIAGNOSIS — K5900 Constipation, unspecified: Secondary | ICD-10-CM | POA: Diagnosis not present

## 2023-12-29 DIAGNOSIS — I10 Essential (primary) hypertension: Secondary | ICD-10-CM | POA: Diagnosis not present

## 2023-12-29 DIAGNOSIS — F209 Schizophrenia, unspecified: Secondary | ICD-10-CM | POA: Diagnosis not present

## 2023-12-29 DIAGNOSIS — F32A Depression, unspecified: Secondary | ICD-10-CM | POA: Diagnosis not present

## 2023-12-29 DIAGNOSIS — J45901 Unspecified asthma with (acute) exacerbation: Secondary | ICD-10-CM | POA: Diagnosis not present

## 2023-12-29 DIAGNOSIS — Z79899 Other long term (current) drug therapy: Secondary | ICD-10-CM | POA: Diagnosis not present

## 2023-12-29 DIAGNOSIS — R0902 Hypoxemia: Secondary | ICD-10-CM | POA: Diagnosis not present

## 2023-12-29 DIAGNOSIS — J441 Chronic obstructive pulmonary disease with (acute) exacerbation: Secondary | ICD-10-CM | POA: Diagnosis not present

## 2023-12-29 MED ORDER — IPRATROPIUM-ALBUTEROL 0.5-2.5 (3) MG/3ML IN SOLN
3.0000 mL | Freq: Two times a day (BID) | RESPIRATORY_TRACT | Status: DC
  Administered 2023-12-29 – 2023-12-31 (×4): 3 mL via RESPIRATORY_TRACT
  Filled 2023-12-29 (×4): qty 3

## 2023-12-29 MED ORDER — QUETIAPINE FUMARATE 100 MG PO TABS
200.0000 mg | ORAL_TABLET | Freq: Every day | ORAL | Status: DC
  Administered 2023-12-29 – 2023-12-30 (×2): 200 mg via ORAL
  Filled 2023-12-29 (×2): qty 2

## 2023-12-29 MED ORDER — FUROSEMIDE 20 MG PO TABS
20.0000 mg | ORAL_TABLET | Freq: Once | ORAL | Status: AC
  Administered 2023-12-29: 20 mg via ORAL
  Filled 2023-12-29: qty 1

## 2023-12-29 NOTE — Progress Notes (Signed)
 Occupational Therapy Treatment Patient Details Name: Rita Hunter MRN: [id number] DOB: 06-13-1970 Today's Date: 12/29/2023   History of present illness Pt is a 53 y.o female admitted 11/14 for COPD exacerbation. PMH: COPD, depression, HTN, schizophrenia   OT comments  Pt presented in bed and agreeable to session. Pt started to ambulate to sink with supervision and set up of materials but started to feel dizzy and requested to go back to bed and required very light HHA with CGA. Pt's BP 163/91 (112) and o2 87% on RA. She then reported need to toilet and BSC was set up at EOB and pt completed with supervision to CGA.  Pt then on RA went down to 84% and was unable increase so was placed on 2L and nursing was made aware. At this time no follow up.      If plan is discharge home, recommend the following:  Assistance with cooking/housework;Assist for transportation   Equipment Recommendations  None recommended by OT    Recommendations for Other Services      Precautions / Restrictions Precautions Precautions: Fall Recall of Precautions/Restrictions: Intact Precaution/Restrictions Comments: monitor O2 Restrictions Weight Bearing Restrictions Per Provider Order: No       Mobility Bed Mobility Overal bed mobility: Independent                  Transfers Overall transfer level: Needs assistance Equipment used: 1 person hand held assist, None Transfers: Sit to/from Stand             General transfer comment: Pt intially did not need assist but started to feel dizzy and o2 destated and pt required very light HHA.     Balance Overall balance assessment: Needs assistance Sitting-balance support: Feet supported Sitting balance-Leahy Scale: Good     Standing balance support: Single extremity supported Standing balance-Leahy Scale: Fair Standing balance comment: good-fair                           ADL either performed or assessed with clinical judgement    ADL Overall ADL's : Needs assistance/impaired Eating/Feeding: Set up;Sitting   Grooming: Supervision/safety;Standing;Oral care;Wash/dry face   Upper Body Bathing: Supervision/ safety;Sitting   Lower Body Bathing: Supervison/ safety;Sit to/from stand   Upper Body Dressing : Supervision/safety;Sitting   Lower Body Dressing: Supervision/safety;Sit to/from stand   Toilet Transfer: Supervision/safety;Cueing for safety;Cueing for sequencing   Toileting- Clothing Manipulation and Hygiene: Supervision/safety;Contact guard assist       Functional mobility during ADLs: Supervision/safety;Contact guard assist      Extremity/Trunk Assessment Upper Extremity Assessment Upper Extremity Assessment: Overall WFL for tasks assessed   Lower Extremity Assessment Lower Extremity Assessment: Defer to PT evaluation        Vision   Vision Assessment?: No apparent visual deficits   Perception     Praxis     Communication Communication Communication: No apparent difficulties   Cognition Arousal: Alert Behavior During Therapy: WFL for tasks assessed/performed Cognition: No apparent impairments                               Following commands: Intact        Cueing   Cueing Techniques: Verbal cues  Exercises      Shoulder Instructions       General Comments      Pertinent Vitals/ Pain       Pain Assessment Pain Assessment: 0-10 Pain  Score: 1  Pain Location: Pt reported some stiffnes Pain Descriptors / Indicators: Sore Pain Intervention(s): Limited activity within patient's tolerance, Monitored during session, Repositioned  Home Living                                          Prior Functioning/Environment              Frequency  Min 2X/week        Progress Toward Goals  OT Goals(current goals can now be found in the care plan section)  Progress towards OT goals: Progressing toward goals  Acute Rehab OT Goals Patient  Stated Goal: to breath better OT Goal Formulation: With patient Time For Goal Achievement: 01/10/24 Potential to Achieve Goals: Good  Plan      Co-evaluation                 AM-PAC OT 6 Clicks Daily Activity     Outcome Measure   Help from another person eating meals?: None Help from another person taking care of personal grooming?: A Little Help from another person toileting, which includes using toliet, bedpan, or urinal?: A Little Help from another person bathing (including washing, rinsing, drying)?: A Little Help from another person to put on and taking off regular upper body clothing?: A Little Help from another person to put on and taking off regular lower body clothing?: A Little 6 Click Score: 19    End of Session Equipment Utilized During Treatment: Gait belt  OT Visit Diagnosis: Unsteadiness on feet (R26.81);Other abnormalities of gait and mobility (R26.89);Muscle weakness (generalized) (M62.81)   Activity Tolerance Patient limited by fatigue   Patient Left in bed;with call bell/phone within reach   Nurse Communication Mobility status (need for breathing tx)        Time: 9240-9183 OT Time Calculation (min): 17 min  Charges: OT General Charges $OT Visit: 1 Visit OT Treatments $Self Care/Home Management : 8-22 mins  Warrick POUR OTR/L  Acute Rehab Services  [phone] office number   Warrick Berber 12/29/2023, 9:59 AM

## 2023-12-29 NOTE — Assessment & Plan Note (Addendum)
 Schizophrenia/Anxiety/Depression: hold home seroquel  with qtc of 502, zoloft  when med rec complete Social needs: SW consult placed for PCP, unhoused status, food insecurity, difficulty obtaining medications Lung Nodules on CT: follow up outpatient with PET-CT

## 2023-12-29 NOTE — Plan of Care (Signed)
   Problem: Education: Goal: Knowledge of General Education information will improve Description: Including pain rating scale, medication(s)/side effects and non-pharmacologic comfort measures Outcome: Progressing   Problem: Activity: Goal: Risk for activity intolerance will decrease Outcome: Progressing   Problem: Coping: Goal: Level of anxiety will decrease Outcome: Progressing

## 2023-12-29 NOTE — Telephone Encounter (Signed)
 Pharmacy Patient Advocate Encounter  Insurance verification completed.    The patient is insured through [facility].     Ran test claim for Brand Symbicort 80-4.5mcg and the current 30 day co-pay is $4.  Ran test claim for Brand Advair  Diskus and the current 30 day co-pay is $4.  Ran test claim for Wilhemina Layer and the current 30 day co-pay is $4.   This test claim was processed through [employer]- copay amounts may vary at other pharmacies due to [employer], or as the patient moves through the different stages of their insurance plan.

## 2023-12-29 NOTE — Progress Notes (Signed)
 Mobility Specialist Progress Note:    12/29/23 1112  Mobility  Activity Ambulated with assistance  Level of Assistance Contact guard assist, steadying assist  Assistive Device None  Distance Ambulated (ft) 150 ft  Range of Motion/Exercises Active  Activity Response Tolerated fair  Mobility Referral Yes  Mobility visit 1 Mobility  Mobility Specialist Start Time (ACUTE ONLY) 1112  Mobility Specialist Stop Time (ACUTE ONLY) 1120  Mobility Specialist Time Calculation (min) (ACUTE ONLY) 8 min   Received pt laying in bed agreeable to session. Some SOB of breathe but otherwise no c/o any symptoms. Pt moving and ambulating decently. Needed to take 1 standing break to perform PLB to recover before completing session. Left pt on EOB w/ all needs met. RN notified.   Venetia Keel Mobility Specialist Please Neurosurgeon or Rehab Office at [phone]

## 2023-12-29 NOTE — Plan of Care (Signed)

## 2023-12-29 NOTE — H&P (View-Only) (Signed)
 Daily Progress Note Intern Pager: [phone]  Patient name: Rita Hunter Medical record number: [id number] Date of birth: 1970-09-07 Age: 53 y.o. Gender: female  Primary Care Provider: Pcp, No Consultants: None Code Status: Full  Pt Overview and Major Events to Date:  11/14: Admitted for COPD exacerbation in the setting of missed medications x1 week  Assessment and Plan:  Lynnox Girten is a 53 y.o. female w/PMHx of COPD presenting with shortness of breath in the setting of recent URI and being out of all medications >1 week.    Hospital course has been complicated by intractable chronic back and chest pain. Adjunctive therapy added. Patient is not a good candidate to be started on chronic opioids inpatient. Will need to establish care with pain management outpatient and wean opioids as tolerated.  Assessment & Plan COPD exacerbation (HCC) Acute hypoxic respiratory failure (HCC) - Goal O2 88-92% - Anoro Ellipta 1 puff daily for maintenance  - DuoNeb TID scheduled - Albuterol  2.5 mg neb q4h PRN  - Azithromycin  500 mg (11/15-11/17) - Continue Prednisone  40 mg (11/15-11/19) - Ambulatory O2 once weaned to RA   - With ambulatory walking test, satted down to 84% on RA ambulation and back up to 92% on 2L with rest  - Alpha 1 antitrypsin pending  - Consult pulmonary, appreciate recs - PDE meds appropriate at this time? - Echo 11/15: LVEF 55-60%, moderate PAH - One time oral Lasix 20mg  for mild diuresis; repeat as indicated - TOC for housing, SDOH Muscle spasm Chronic chest and back. Difficult to control over hospital course. No complaints this AM. Continue adequate pain regimen. - Tylenol  650mg  q6h for mild pain - Oxycodone 5mg  q6h for moderate pain, wean as able  - Tizanidine  2mg  q8h PRN for muscle spasms - Gabapentin  600 mg TID - Lidocaine Patch q24h  - Heating pad  Chronic health problem Schizophrenia/Anxiety/Depression: hold home seroquel  with qtc of 502, zoloft  when med rec  complete Social needs: SW consult placed for PCP, unhoused status, food insecurity, difficulty obtaining medications Lung Nodules on CT: follow up outpatient with PET-CT   FEN/GI: Regular diet  PPx: Lovenox Dispo:Home pending clinical improvement .   Subjective:  Patient was seen and examined at bedside. No overnight events. BP has remained elevated but stable with systolic in 150s. Weaned off O2 overnight, satting 96% on RA. Pursed lip breathing during interview.   Objective: Temp:  [97.3 F (36.3 C)-98.6 F (37 C)] 97.3 F (36.3 C) (11/17 0530) Pulse Rate:  [71-91] 75 (11/17 0530) Resp:  [16-20] 16 (11/17 0530) BP: (131-163)/(72-95) 159/95 (11/17 0530) SpO2:  [93 %-98 %] 96 % (11/17 0530) Physical Exam: General: sitting up in bed mildly uncomfortable with pursed lip breathing in no acute distress Cardiovascular: RRR, no m/r/g,  Respiratory: pursed breathing but no tracheal tugging, satting above 89% on RA, good air movement throughout lung fields, faint crackles to L lower lung base  Abdomen: soft, non-tender, non-distended Extremities: no peripheral edema, moves all extremities equally  Laboratory: Most recent CBC Lab Results  Component Value Date   WBC 9.5 12/27/2023   HGB 13.0 12/27/2023   HCT 40.0 12/27/2023   MCV 93.5 12/27/2023   PLT 212 12/27/2023   Most recent BMP    Latest Ref Rng & Units 12/27/2023    5:53 AM  BMP  Glucose 70 - 99 mg/dL 861   BUN 6 - 20 mg/dL 27   Creatinine 9.55 - 1.00 mg/dL 9.01   Sodium 864 -  145 mmol/L 139   Potassium 3.5 - 5.1 mmol/L 4.1   Chloride 98 - 111 mmol/L 104   CO2 22 - 32 mmol/L 24   Calcium 8.9 - 10.3 mg/dL 8.8    Lupie Credit, DO 12/29/2023, 7:24 AM  PGY-1, [facility] FPTS Intern pager: [phone], text pages welcome Secure chat group [facility] Teaching Service

## 2023-12-29 NOTE — Consult Note (Signed)
 NAMESahithi Hunter, MRN:  [id number], DOB:  1970/03/06, LOS: 3 ADMISSION DATE:  12/26/2023, CONSULTATION DATE: 12/29/2023 REFERRING MD: Family medicine service, CHIEF COMPLAINT: COPD  History of Present Illness:  Patient with known emphysema COPD Ran out of medications recently admitted with shortness of breath in the setting of a URI and being out of inhalers BNP elevated at presentation  Still an active smoker  Pertinent  Medical History   Past Medical History:  Diagnosis Date   Allergy    Anxiety    Asthma    COPD (chronic obstructive pulmonary disease) (HCC)    Depression    Emphysema of lung (HCC)    High blood pressure    Neuromuscular disorder (HCC)    Significant Hospital Events: Including procedures, antibiotic start and stop dates in addition to other pertinent events   CT chest reviewed showing extensive emphysematous changes  Echo with moderate pulmonary hypertension  Interim History / Subjective:  Feeling a little bit better concerned about still being on oxygen  Objective    Blood pressure (!) 145/82, pulse 78, temperature 97.6 F (36.4 C), temperature source Oral, resp. rate 20, height 5' 8 (1.727 m), weight 55.3 kg, SpO2 97%.        Intake/Output Summary (Last 24 hours) at 12/29/2023 1527 Last data filed at 12/29/2023 0849 Gross per 24 hour  Intake 834 ml  Output --  Net 834 ml   Filed Weights   12/26/23 2200  Weight: 55.3 kg    Examination: General: Middle-age lady, does not appear to be in distress at rest HENT: Moist oral mucosa Lungs: Decreased air movement bilaterally Cardiovascular: S1-S2 appreciated Abdomen: Soft, bowel sounds appreciated Extremities: No clubbing, no edema Neuro: Awake alert interactive GU: Fair output  Resolved problem list   Assessment and Plan   Chronic obstructive pulmonary disease with exacerbation - Continue course of antibiotics - Continue steroids - Continue bronchodilators  Heart failure with  preserved ejection fraction - Elevated BNP  Moderate pulmonary hypertension - Though severe advanced chronic obstructive pulmonary disease can be associated with mild to moderate pulmonary hypertension, the degree of pulmonary hypertension in this situation may be out of context - Patient may benefit from right heart catheterization  Hypoxemia likely related to COPD and also presence of pulmonary hypertension - Even after stabilization may require oxygen supplementation in the short to medium term  Active smoker - Smoking cessation counseling  Lung nodules - Will need follow-up CT scan in about 3 months   Labs   CBC: Recent Labs  Lab 12/26/23 0807 12/26/23 0808 12/27/23 0553  WBC  --  12.7* 9.5  NEUTROABS  --  7.0  --   HGB 15.3* 14.8 13.0  HCT 45.0 45.6 40.0  MCV  --  93.4 93.5  PLT  --  264 212    Basic Metabolic Panel: Recent Labs  Lab 12/26/23 0807 12/26/23 0808 12/27/23 0553  NA 139 139 139  K 3.7 3.6 4.1  CL 103 102 104  CO2  --  26 24  GLUCOSE 131* 135* 138*  BUN 12 9 27*  CREATININE 0.60 0.71 0.98  CALCIUM  --  8.5* 8.8*   GFR: Estimated Creatinine Clearance: 58 mL/min (by C-G formula based on SCr of 0.98 mg/dL). Recent Labs  Lab 12/26/23 0804 12/26/23 0808 12/26/23 0951 12/27/23 0553  WBC  --  12.7*  --  9.5  LATICACIDVEN 0.6  --  0.5  --     Liver Function Tests: Recent  Labs  Lab 12/26/23 0808  AST 47*  ALT 43  ALKPHOS 47  BILITOT 0.8  PROT 6.7  ALBUMIN 3.5   Recent Labs  Lab 12/26/23 0808  LIPASE 53*   No results for input(s): AMMONIA in the last 168 hours.  ABG    Component Value Date/Time   TCO2 30 12/26/2023 0807     Coagulation Profile: No results for input(s): INR, PROTIME in the last 168 hours.  Cardiac Enzymes: No results for input(s): CKTOTAL, CKMB, CKMBINDEX, TROPONINI in the last 168 hours.  HbA1C: Hgb A1c MFr Bld  Date/Time Value Ref Range Status  12/25/2018 09:55 AM 5.3 4.8 - 5.6 % Final     Comment:             Prediabetes: 5.7 - 6.4          Diabetes: >6.4          Glycemic control for adults with diabetes: <7.0   08/26/2016 03:43 PM 5.7 (H) 4.8 - 5.6 % Final    Comment:             Pre-diabetes: 5.7 - 6.4          Diabetes: >6.4          Glycemic control for adults with diabetes: <7.0     CBG: No results for input(s): GLUCAP in the last 168 hours.  Review of Systems:   Feeling overall better  Past Medical History:  She,  has a past medical history of Allergy, Anxiety, Asthma, COPD (chronic obstructive pulmonary disease) (HCC), Depression, Emphysema of lung (HCC), High blood pressure, and Neuromuscular disorder (HCC).   Surgical History:   Past Surgical History:  Procedure Laterality Date   HERNIA REPAIR  2017   3   lapoma removal  Right    Lumbar laminectony and fusion  2015   2     Social History:   reports that she has been smoking cigarettes. She started smoking about 25 years ago. She has a 12.8 pack-year smoking history. She has never used smokeless tobacco. She reports that she does not drink alcohol and does not use drugs.   Family History:  Her family history includes Diabetes in her mother and sister; Heart disease in her mother and sister; Hyperlipidemia in her father and sister; Mental illness in her mother; Stroke in her father and sister.   Allergies Allergies  Allergen Reactions   Mobic [Meloxicam] Swelling    Swelling of tongue    Trazodone  And Nefazodone    Ultram [Tramadol Hcl] Swelling    Swelling of tongue     Home Medications  Prior to Admission medications   Medication Sig Start Date End Date Taking? Authorizing Provider  gabapentin  (NEURONTIN ) 600 MG tablet Take 1 tablet by mouth 3 times a day Patient taking differently: Take 1,200 mg by mouth 3 (three) times daily. 07/12/20  Yes Kip Ade, NP  lisinopril  (ZESTRIL ) 10 MG tablet Take 1 tablet by mouth every day Patient taking differently: Take 10 mg by mouth at  bedtime. 09/19/20  Yes Kip Ade, NP  metoprolol  tartrate (LOPRESSOR ) 50 MG tablet Take 1 tablet by mouth twice daily. Need appt 05/16/20  Yes Kip Ade, NP  QUEtiapine  (SEROQUEL ) 200 MG tablet Take 1 tablet (200 mg total) by mouth at bedtime. 01/04/20  Yes Kip Ade, NP  rOPINIRole  (REQUIP ) 2 MG tablet TAKE 1 TO 2 TABLETS(2 TO 4 MG) BY MOUTH AT BEDTIME Patient taking differently: Take 2 mg by mouth at  bedtime. 01/28/19  Yes Kip Ade, NP  sertraline  (ZOLOFT ) 100 MG tablet Take 1 tablet by mouth every day. Need appt Patient taking differently: Take 100 mg by mouth in the morning and at bedtime. 05/16/20  Yes Kip Ade, NP  tiZANidine  (ZANAFLEX ) 4 MG tablet Take 1 tablet by mouth every 6 hours as needed for muscle spasms Patient taking differently: Take 4 mg by mouth 3 (three) times daily. 01/04/20  Yes Kip Ade, NP  topiramate  (TOPAMAX ) 25 MG tablet Take 1 tablet (25 mg total) by mouth 2 (two) times daily. 01/04/20  Yes Kip Ade, NP  UNKNOWN TO PATIENT Take 50 mg by mouth at bedtime. Unknown medication for sleep, beginning with N   Yes [provider]  albuterol  (PROAIR  HFA) 108 (90 Base) MCG/ACT inhaler Inhale 2 puffs by mouth every 4 hours as needed for wheezing or shortness of breath Patient not taking: Reported on 12/26/2023 06/10/18   Kip Ade, NP  clonazePAM (KLONOPIN PO) Take 1 tablet by mouth 3 (three) times daily as needed (anxiety). Patient not taking: Reported on 12/26/2023    [provider]  fluticasone -salmeterol (ADVAIR  HFA) 115-21 MCG/ACT inhaler Inhale 2 puffs into the lungs 2 (two) times daily. Patient not taking: Reported on 12/26/2023    [provider]  losartan (COZAAR) 25 MG tablet Take 25 mg by mouth at bedtime. Patient not taking: Reported on 12/26/2023    [provider]  oxyCODONE-acetaminophen  (PERCOCET) 10-325 MG tablet Take 1 tablet by mouth 3 (three) times daily as needed for  pain. Patient not taking: Reported on 12/26/2023    [provider]  tiotropium (SPIRIVA  HANDIHALER) 18 MCG inhalation capsule Inhale the contents of 1 capsule via handihaler every day Patient not taking: Reported on 12/26/2023 10/17/20   Kip Ade, NP     Jennet Epley, MD [facility] Pager: See Tracey

## 2023-12-29 NOTE — Assessment & Plan Note (Addendum)
-   Goal O2 88-92% - Anoro Ellipta 1 puff daily for maintenance  - DuoNeb TID scheduled - Albuterol  2.5 mg neb q4h PRN  - Azithromycin  500 mg (11/15-11/17) - Continue Prednisone  40 mg (11/15-11/19) - Ambulatory O2 once weaned to RA   - With ambulatory walking test, satted down to 84% on RA ambulation and back up to 92% on 2L with rest  - Alpha 1 antitrypsin pending  - Consult pulmonary, appreciate recs - PDE meds appropriate at this time? - Echo 11/15: LVEF 55-60%, moderate PAH - One time oral Lasix 20mg  for mild diuresis; repeat as indicated - TOC for housing, SDOH

## 2023-12-29 NOTE — Plan of Care (Signed)

## 2023-12-29 NOTE — Assessment & Plan Note (Addendum)
 Chronic chest and back. Difficult to control over hospital course. No complaints this AM. Continue adequate pain regimen. - Tylenol  650mg  q6h for mild pain - Oxycodone 5mg  q6h for moderate pain, wean as able  - Tizanidine  2mg  q8h PRN for muscle spasms - Gabapentin  600 mg TID - Lidocaine Patch q24h  - Heating pad

## 2023-12-29 NOTE — Progress Notes (Signed)
 Nurse requested Mobility Specialist to perform oxygen saturation test with pt which includes removing pt from oxygen both at rest and while ambulating.  Below are the results from that testing.     Patient Saturations on Room Air at Rest = spO2 91%  Patient Saturations on Room Air while Ambulating = sp02 88% .   Patient Saturations on 2 Liters of oxygen while Ambulating = sp02 92%  At end of testing pt left in room on 1  Liters of oxygen.  Reported results to nurse.

## 2023-12-29 NOTE — Progress Notes (Signed)
 Daily Progress Note Intern Pager: [phone]  Patient name: Rita Hunter Medical record number: [id number] Date of birth: 1970-09-07 Age: 53 y.o. Gender: female  Primary Care Provider: Pcp, No Consultants: None Code Status: Full  Pt Overview and Major Events to Date:  11/14: Admitted for COPD exacerbation in the setting of missed medications x1 week  Assessment and Plan:  Rita Hunter is a 53 y.o. female w/PMHx of COPD presenting with shortness of breath in the setting of recent URI and being out of all medications >1 week.    Hospital course has been complicated by intractable chronic back and chest pain. Adjunctive therapy added. Patient is not a good candidate to be started on chronic opioids inpatient. Will need to establish care with pain management outpatient and wean opioids as tolerated.  Assessment & Plan COPD exacerbation (HCC) Acute hypoxic respiratory failure (HCC) - Goal O2 88-92% - Anoro Ellipta 1 puff daily for maintenance  - DuoNeb TID scheduled - Albuterol  2.5 mg neb q4h PRN  - Azithromycin  500 mg (11/15-11/17) - Continue Prednisone  40 mg (11/15-11/19) - Ambulatory O2 once weaned to RA   - With ambulatory walking test, satted down to 84% on RA ambulation and back up to 92% on 2L with rest  - Alpha 1 antitrypsin pending  - Consult pulmonary, appreciate recs - PDE meds appropriate at this time? - Echo 11/15: LVEF 55-60%, moderate PAH - One time oral Lasix 20mg  for mild diuresis; repeat as indicated - TOC for housing, SDOH Muscle spasm Chronic chest and back. Difficult to control over hospital course. No complaints this AM. Continue adequate pain regimen. - Tylenol  650mg  q6h for mild pain - Oxycodone 5mg  q6h for moderate pain, wean as able  - Tizanidine  2mg  q8h PRN for muscle spasms - Gabapentin  600 mg TID - Lidocaine Patch q24h  - Heating pad  Chronic health problem Schizophrenia/Anxiety/Depression: hold home seroquel  with qtc of 502, zoloft  when med rec  complete Social needs: SW consult placed for PCP, unhoused status, food insecurity, difficulty obtaining medications Lung Nodules on CT: follow up outpatient with PET-CT   FEN/GI: Regular diet  PPx: Lovenox Dispo:Home pending clinical improvement .   Subjective:  Patient was seen and examined at bedside. No overnight events. BP has remained elevated but stable with systolic in 150s. Weaned off O2 overnight, satting 96% on RA. Pursed lip breathing during interview.   Objective: Temp:  [97.3 F (36.3 C)-98.6 F (37 C)] 97.3 F (36.3 C) (11/17 0530) Pulse Rate:  [71-91] 75 (11/17 0530) Resp:  [16-20] 16 (11/17 0530) BP: (131-163)/(72-95) 159/95 (11/17 0530) SpO2:  [93 %-98 %] 96 % (11/17 0530) Physical Exam: General: sitting up in bed mildly uncomfortable with pursed lip breathing in no acute distress Cardiovascular: RRR, no m/r/g,  Respiratory: pursed breathing but no tracheal tugging, satting above 89% on RA, good air movement throughout lung fields, faint crackles to L lower lung base  Abdomen: soft, non-tender, non-distended Extremities: no peripheral edema, moves all extremities equally  Laboratory: Most recent CBC Lab Results  Component Value Date   WBC 9.5 12/27/2023   HGB 13.0 12/27/2023   HCT 40.0 12/27/2023   MCV 93.5 12/27/2023   PLT 212 12/27/2023   Most recent BMP    Latest Ref Rng & Units 12/27/2023    5:53 AM  BMP  Glucose 70 - 99 mg/dL 861   BUN 6 - 20 mg/dL 27   Creatinine 9.55 - 1.00 mg/dL 9.01   Sodium 864 -  145 mmol/L 139   Potassium 3.5 - 5.1 mmol/L 4.1   Chloride 98 - 111 mmol/L 104   CO2 22 - 32 mmol/L 24   Calcium 8.9 - 10.3 mg/dL 8.8    Lupie Credit, DO 12/29/2023, 7:24 AM  PGY-1, [facility] Family Medicine FPTS Intern pager: [phone], text pages welcome Secure chat group [facility] Teaching Service

## 2023-12-30 ENCOUNTER — Encounter (HOSPITAL_COMMUNITY): Payer: Self-pay | Admitting: Internal Medicine

## 2023-12-30 ENCOUNTER — Encounter (HOSPITAL_COMMUNITY)
Admission: EM | Disposition: A | Discharge status: Home / Self Care | Payer: Self-pay | Source: Home / Self Care | Attending: Family Medicine

## 2023-12-30 DIAGNOSIS — R0602 Shortness of breath: Secondary | ICD-10-CM | POA: Diagnosis not present

## 2023-12-30 HISTORY — PX: RIGHT HEART CATH: CATH118263

## 2023-12-30 LAB — MISC LABCORP TEST (SEND OUT): Labcorp test code: 83935

## 2023-12-30 LAB — BASIC METABOLIC PANEL WITH GFR
Anion gap: 10 (ref 5–15)
BUN: 25 mg/dL — ABNORMAL HIGH (ref 6–20)
CO2: 32 mmol/L (ref 22–32)
Calcium: 9.1 mg/dL (ref 8.9–10.3)
Chloride: 97 mmol/L — ABNORMAL LOW (ref 98–111)
Creatinine, Ser: 0.85 mg/dL (ref 0.44–1.00)
GFR, Estimated: >60 mL/min (ref >60)
Glucose, Bld: 124 mg/dL — ABNORMAL HIGH (ref 70–99)
Potassium: 4.7 mmol/L (ref 3.5–5.1)
Sodium: 139 mmol/L (ref 135–145)

## 2023-12-30 LAB — POCT I-STAT EG7
Acid-Base Excess: 5 mmol/L — ABNORMAL HIGH (ref 0.0–2.0)
Bicarbonate: 33.2 mmol/L — ABNORMAL HIGH (ref 20.0–28.0)
Calcium, Ion: 1.26 mmol/L (ref 1.15–1.40)
HCT: 44 % (ref 36.0–46.0)
Hemoglobin: 15 g/dL (ref 12.0–15.0)
O2 Saturation: 63 %
Potassium: 5.4 mmol/L — ABNORMAL HIGH (ref 3.5–5.1)
Sodium: 138 mmol/L (ref 135–145)
TCO2: 35 mmol/L — ABNORMAL HIGH (ref 22–32)
pCO2, Ven: 62 mmHg — ABNORMAL HIGH (ref 44–60)
pH, Ven: 7.338 (ref 7.25–7.43)
pO2, Ven: 36 mmHg (ref 32–45)

## 2023-12-30 SURGERY — RIGHT HEART CATH
Anesthesia: LOCAL

## 2023-12-30 MED ORDER — OXYCODONE HCL 5 MG PO TABS
5.0000 mg | ORAL_TABLET | Freq: Every day | ORAL | Status: DC | PRN
  Administered 2023-12-30 – 2023-12-31 (×2): 5 mg via ORAL
  Filled 2023-12-30 (×2): qty 1

## 2023-12-30 MED ORDER — SODIUM CHLORIDE 0.9 % IV SOLN: 250.0000 mL | INTRAVENOUS | Status: DC | PRN

## 2023-12-30 MED ORDER — LIDOCAINE HCL (PF) 1 % IJ SOLN
INTRAMUSCULAR | Status: DC | PRN
  Administered 2023-12-30: 2 mL via INTRADERMAL

## 2023-12-30 MED ORDER — LIDOCAINE HCL (PF) 1 % IJ SOLN
INTRAMUSCULAR | Status: AC
Start: 2023-12-30 — End: 2023-12-30
  Filled 2023-12-30: qty 30

## 2023-12-30 MED ORDER — SODIUM CHLORIDE 0.9% FLUSH: 3.0000 mL | INTRAVENOUS | Status: DC | PRN

## 2023-12-30 MED ORDER — TIZANIDINE HCL 4 MG PO TABS
2.0000 mg | ORAL_TABLET | Freq: Three times a day (TID) | ORAL | Status: DC
  Administered 2023-12-30 – 2023-12-31 (×3): 2 mg via ORAL
  Filled 2023-12-30 (×3): qty 1

## 2023-12-30 MED ORDER — SODIUM CHLORIDE 0.9% FLUSH
3.0000 mL | Freq: Two times a day (BID) | INTRAVENOUS | Status: DC
  Administered 2023-12-30: 3 mL via INTRAVENOUS

## 2023-12-30 MED ORDER — HEPARIN (PORCINE) IN NACL 1000-0.9 UT/500ML-% IV SOLN
INTRAVENOUS | Status: DC | PRN
  Administered 2023-12-30: 500 mL

## 2023-12-30 SURGICAL SUPPLY — 6 items
CATH SWAN GANZ 7F STRAIGHT (CATHETERS) IMPLANT
GLIDESHEATH SLENDER 7FR .021G (SHEATH) IMPLANT
GUIDEWIRE .025 260CM (WIRE) IMPLANT
PACK CARDIAC CATHETERIZATION (CUSTOM PROCEDURE TRAY) ×1 IMPLANT
TRANSDUCER W/STOPCOCK (MISCELLANEOUS) IMPLANT
TUBING ART PRESS 72 MALE/FEM (TUBING) IMPLANT

## 2023-12-30 NOTE — Progress Notes (Signed)
 Physical Therapy Treatment Patient Details Name: Rita Hunter MRN: [id number] DOB: September 10, 1970 Today's Date: 12/30/2023   History of Present Illness 53 y.o F adm 12/26/23 for COPD exacerbation. PMH: COPD, depression, HTN, schizophrenia, chronic pain    PT Comments  PT pleasant and agreeable to limited mobility. Pt on RA at rest 89% with need for 2L during gait to maintain 92%. Pt states desire to hopefully wean off O2 prior to D/C and encouraged purchase of pulse ox for home. Pt denied OOB to chair or HEP after gait stating fatigue. Will continue to follow.     If plan is discharge home, recommend the following: Assist for transportation   Can travel by private vehicle        Equipment Recommendations  Cane    Recommendations for Other Services       Precautions / Restrictions Precautions Precautions: Fall;Other (comment) Recall of Precautions/Restrictions: Intact Precaution/Restrictions Comments: monitor O2     Mobility  Bed Mobility Overal bed mobility: Independent                  Transfers Overall transfer level: Independent                      Ambulation/Gait Ambulation/Gait assistance: Supervision Gait Distance (Feet): 130 Feet Assistive device: None Gait Pattern/deviations: Step-through pattern, Decreased stride length, Narrow base of support   Gait velocity interpretation: <1.8 ft/sec, indicate of risk for recurrent falls   General Gait Details: pt walked 130' x 2 with seated rest between trials and stating anxiety. Pt with intermittent rail use stating she feels slightly unsteady without overt LOB. Pt with drop to 88% on 1L requiring 2L to maintain 92% with long hall gait   Stairs Stairs: Yes Stairs assistance: Modified independent (Device/Increase time) Stair Management: One rail Right, Alternating pattern, Forwards Number of Stairs: 2     Wheelchair Mobility     Tilt Bed    Modified Rankin (Stroke Patients Only)        Balance Overall balance assessment: Mild deficits observed, not formally tested   Sitting balance-Leahy Scale: Good       Standing balance-Leahy Scale: Good                              Communication Communication Communication: No apparent difficulties  Cognition Arousal: Alert Behavior During Therapy: WFL for tasks assessed/performed   PT - Cognitive impairments: No apparent impairments                       PT - Cognition Comments: pt appropriate but did state anxiety with being in the hall with people Following commands: Intact      Cueing Cueing Techniques: Verbal cues  Exercises      General Comments        Pertinent Vitals/Pain Pain Assessment Pain Assessment: No/denies pain    Home Living                          Prior Function            PT Goals (current goals can now be found in the care plan section) Progress towards PT goals: Progressing toward goals    Frequency    Min 2X/week      PT Plan      Co-evaluation  AM-PAC PT 6 Clicks Mobility   Outcome Measure  Help needed turning from your back to your side while in a flat bed without using bedrails?: None Help needed moving from lying on your back to sitting on the side of a flat bed without using bedrails?: None Help needed moving to and from a bed to a chair (including a wheelchair)?: None Help needed standing up from a chair using your arms (e.g., wheelchair or bedside chair)?: None Help needed to walk in hospital room?: A Little Help needed climbing 3-5 steps with a railing? : A Little 6 Click Score: 22    End of Session Equipment Utilized During Treatment: Oxygen Activity Tolerance: Patient tolerated treatment well Patient left: in bed;with call bell/phone within reach (pt denied OOB to chair) Nurse Communication: Mobility status PT Visit Diagnosis: Other abnormalities of gait and mobility (R26.89);Difficulty in walking, not  elsewhere classified (R26.2)     Time: 9089-9075 PT Time Calculation (min) (ACUTE ONLY): 14 min  Charges:    $Gait Training: 8-22 mins PT General Charges $$ ACUTE PT VISIT: 1 Visit                     Lenoard SQUIBB, PT Acute Rehabilitation Services Office: [phone]    Lenoard NOVAK Rodman Recupero 12/30/2023, 10:31 AM

## 2023-12-30 NOTE — Plan of Care (Signed)
 Went to see patient post right heart cath.  She reports she is doing well overall.  Continues to report some shortness of breath, and is currently on 1.5 L O2 via [place].  Nuys chest pain.  Reports some discomfort at the site of her cath in her right anterior arm.    Physical exam Cardiovascular: Regular rate and rhythm, no murmurs/rubs/gallops. Respiratory: Normal work of breathing on 1.5 liter O2 via [place]. Clear to auscultation bilaterally; no wheezes, crackles.  A&P: Will continue to treat patient for COPD exacerbation.  Reassuringly, per cath note, essentially normal RHC, making cardiac source of dyspnea very unlikely. - Rest of treatment plan per progress note from earlier today  Alan Flies, MD 12/30/23 3:22 PM

## 2023-12-30 NOTE — Progress Notes (Signed)
 Right Heart Cath Findings:  RA = 2 RV = 28/5 PA = 30/15 (22) PCW = 6 Fick cardiac output/index = 4.0/2.5 Thermo CO/CI = 5.5/3.3 PVR = 4.0 (Fick) 3.34 (TD) Ao sat = 94% PA sat = 63% PAPi = 7.5  Assessment: 1. Minimal PAH with low left-sided filling pressures and normal CO  Plan/Discussion:   Essentially normal RHC. Doubt dyspnea is cardiac.   Toribio Fuel, MD  3:16 PM

## 2023-12-30 NOTE — TOC CM/SW Note (Signed)
 Transition of Care [facility]) - Inpatient Brief Assessment   Patient Details  Name: Rita Hunter MRN: [id number] Date of Birth: 02-22-1970  [facility] of [facility]) CM/SW Contact:    Luise JAYSON Pan, LCSWA Phone Number: 12/30/2023, 9:35 AM   Clinical Narrative: CSW met patient at bedside and discussed housing information. Patient reported that she is living in a [place] on her uncles lawn. CSW provided patient with community resources and shelter list.    Transition of Care Asessment: Insurance and Status: Insurance coverage has been reviewed Patient has primary care physician: No Home environment has been reviewed: Living in [place] on uncles lawn Prior level of function:: Audiological Scientist Home Services: No current home services Social Drivers of Health Review: SDOH reviewed interventions complete Readmission risk has been reviewed: Yes Transition of care needs: transition of care needs identified, TOC will continue to follow

## 2023-12-30 NOTE — Progress Notes (Signed)
 Daily Progress Note Intern Pager: [phone]  Patient name: Rita Hunter Medical record number: [id number] Date of birth: 10-02-1970 Age: 53 y.o. Gender: female  Primary Care Provider: Pcp, No Consultants: PCCM Code Status: Full  Pt Overview and Major Events to Date:  11/14: Admitted for COPD exacerbation in the setting of missed medications x1 week  Assessment and Plan:  Rita Hunter is a 53 y.o. female w/PMHx of COPD presenting with shortness of breath in the setting of recent URI and being out of all medications >1 week.    Hospital course has been complicated by intractable chronic back and chest pain. Adjunctive therapy added. Patient is not a good candidate to be started on chronic opioids inpatient. Will need to establish care with pain management outpatient and wean opioids as tolerated. Getting RHC today per cards. Assessment & Plan COPD exacerbation (HCC) Acute hypoxic respiratory failure (HCC) - Goal O2 88-92% - Anoro Ellipta 1 puff daily for maintenance  - DuoNeb TID scheduled - Albuterol  2.5 mg neb q4h PRN  - Azithromycin  500 mg (11/15-11/17) - Continue Prednisone  40 mg (11/15-11/19) - Ambulatory O2 once weaned to RA   - With ambulatory walking test, satted down to 84% on RA ambulation and back up to 92% on 2L with rest  - Alpha 1 antitrypsin pending  - Consult pulmonary, appreciate recs  - Cor pulmonale? Recommend RHC and cards eval - Echo 11/15: LVEF 55-60%, moderate PAH  - PCCM consulted, appreciate recs   - pt may benefit from RHC   - may require O2 supplementation in short to medium term    - CT scan follow up in 3 months for lung nodules - TOC for housing, SDOH  - CSW provided patient with community resources and shelter list.  Muscle spasm Chronic chest and back. Difficult to control over hospital course. Continue pain regimen.  - Tylenol  650mg  q6h for mild pain - Decrease Oxycodone 5mg  for moderate pain to once a day - Tizanidine  2mg  q8h PRN for  muscle spasms - Gabapentin  600 mg TID - Lidocaine Patch q24h  - Heating pad  Chronic health problem Schizophrenia/Anxiety/Depression: hold home seroquel  with qtc of 502, zoloft  when med rec complete Social needs: SW consult placed for PCP, unhoused status, food insecurity, difficulty obtaining medications Lung Nodules on CT: follow up outpatient with PET-CT   FEN/GI: Regular diet PPx: Lovenox Dispo:Home pending cardiology recommendations.   Subjective:  Patient was seen and evaluated at bedside. Appearing more comfortable on [place] today than yesterday. Com  Objective: Temp:  [94.5 F (34.7 C)-98.5 F (36.9 C)] 97.8 F (36.6 C) (11/18 0648) Pulse Rate:  [72-88] 73 (11/18 0648) Resp:  [18-20] 20 (11/18 0648) BP: (125-159)/(73-95) 149/95 (11/18 0648) SpO2:  [91 %-97 %] 95 % (11/18 0648) Weight:  [54.9 kg] 54.9 kg (11/18 9351) Physical Exam: General: resting comfortably on 2L [place] lying flat in hospital bed in no acute distress  Cardiovascular: RRR, no m/r/g  Respiratory: expiratory wheezes to bilateral upper lobes, normal WOB on 2L [place], diminished breath sounds Abdomen: soft, non-tender, non-distended Extremities: No peripheral edema  Laboratory: Most recent CBC Lab Results  Component Value Date   WBC 9.5 12/27/2023   HGB 13.0 12/27/2023   HCT 40.0 12/27/2023   MCV 93.5 12/27/2023   PLT 212 12/27/2023   Most recent BMP    Latest Ref Rng & Units 12/30/2023    3:07 AM  BMP  Glucose 70 - 99 mg/dL 875   BUN 6 -  20 mg/dL 25   Creatinine 9.55 - 1.00 mg/dL 9.14   Sodium 864 - 854 mmol/L 139   Potassium 3.5 - 5.1 mmol/L 4.7   Chloride 98 - 111 mmol/L 97   CO2 22 - 32 mmol/L 32   Calcium 8.9 - 10.3 mg/dL 9.1    Rita Credit, DO 12/30/2023, 7:31 AM  PGY-1, [facility] Family Medicine FPTS Intern pager: [phone], text pages welcome Secure chat group [facility] Teaching Service

## 2023-12-30 NOTE — Assessment & Plan Note (Addendum)
-   Goal O2 88-92% - Anoro Ellipta 1 puff daily for maintenance  - DuoNeb TID scheduled - Albuterol  2.5 mg neb q4h PRN  - Azithromycin  500 mg (11/15-11/17) - Continue Prednisone  40 mg (11/15-11/19) - Ambulatory O2 once weaned to RA   - With ambulatory walking test, satted down to 84% on RA ambulation and back up to 92% on 2L with rest  - Alpha 1 antitrypsin pending  - Consult pulmonary, appreciate recs  - Cor pulmonale? Recommend RHC and cards eval - Echo 11/15: LVEF 55-60%, moderate PAH  - PCCM consulted, appreciate recs   - pt may benefit from RHC   - may require O2 supplementation in short to medium term    - CT scan follow up in 3 months for lung nodules - TOC for housing, SDOH  - CSW provided patient with community resources and shelter list.

## 2023-12-30 NOTE — Assessment & Plan Note (Addendum)
 Chronic chest and back. Difficult to control over hospital course. Continue pain regimen.  - Tylenol  650mg  q6h for mild pain - Decrease Oxycodone 5mg  for moderate pain to once a day - Tizanidine  2mg  q8h PRN for muscle spasms - Gabapentin  600 mg TID - Lidocaine Patch q24h  - Heating pad

## 2023-12-30 NOTE — Plan of Care (Signed)
   Problem: Education: Goal: Knowledge of General Education information will improve Description: Including pain rating scale, medication(s)/side effects and non-pharmacologic comfort measures Outcome: Progressing   Problem: Clinical Measurements: Goal: Diagnostic test results will improve Outcome: Progressing   Problem: Activity: Goal: Risk for activity intolerance will decrease Outcome: Progressing

## 2023-12-30 NOTE — Assessment & Plan Note (Signed)
 Schizophrenia/Anxiety/Depression: hold home seroquel  with qtc of 502, zoloft  when med rec complete Social needs: SW consult placed for PCP, unhoused status, food insecurity, difficulty obtaining medications Lung Nodules on CT: follow up outpatient with PET-CT

## 2023-12-30 NOTE — Interval H&P Note (Signed)
 History and Physical Interval Note:  12/30/2023 2:18 PM  Rita Hunter  has presented today for surgery, with the diagnosis of shortness of breath.  The various methods of treatment have been discussed with the patient and family. After consideration of risks, benefits and other options for treatment, the patient has consented to  Procedure(s): RIGHT HEART CATH (N/A) as a surgical intervention.  The patient's history has been reviewed, patient examined, no change in status, stable for surgery.  I have reviewed the patient's chart and labs.  Questions were answered to the patient's satisfaction.     Takima Encina

## 2023-12-31 ENCOUNTER — Telehealth: Payer: Self-pay

## 2023-12-31 ENCOUNTER — Other Ambulatory Visit (HOSPITAL_COMMUNITY): Payer: Self-pay

## 2023-12-31 DIAGNOSIS — J441 Chronic obstructive pulmonary disease with (acute) exacerbation: Secondary | ICD-10-CM | POA: Diagnosis not present

## 2023-12-31 LAB — BASIC METABOLIC PANEL WITH GFR
Anion gap: 10 (ref 5–15)
BUN: 32 mg/dL — ABNORMAL HIGH (ref 6–20)
CO2: 32 mmol/L (ref 22–32)
Calcium: 9.2 mg/dL (ref 8.9–10.3)
Chloride: 96 mmol/L — ABNORMAL LOW (ref 98–111)
Creatinine, Ser: 1.01 mg/dL — ABNORMAL HIGH (ref 0.44–1.00)
GFR, Estimated: >60 mL/min (ref >60)
Glucose, Bld: 116 mg/dL — ABNORMAL HIGH (ref 70–99)
Potassium: 5.2 mmol/L — ABNORMAL HIGH (ref 3.5–5.1)
Sodium: 138 mmol/L (ref 135–145)

## 2023-12-31 LAB — CBC
HCT: 40.2 % (ref 36.0–46.0)
Hemoglobin: 12.9 g/dL (ref 12.0–15.0)
MCH: 30.1 pg (ref 26.0–34.0)
MCHC: 32.1 g/dL (ref 30.0–36.0)
MCV: 93.7 fL (ref 80.0–100.0)
Platelets: 212 K/uL (ref 150–400)
RBC: 4.29 MIL/uL (ref 3.87–5.11)
RDW: 13.3 % (ref 11.5–15.5)
WBC: 10.3 K/uL (ref 4.0–10.5)
nRBC: 0 % (ref 0.0–0.2)

## 2023-12-31 LAB — ALPHA-1-ANTITRYPSIN: A-1 Antitrypsin, Ser: 154 mg/dL (ref 101–187)

## 2023-12-31 MED ORDER — GABAPENTIN 600 MG PO TABS
ORAL_TABLET | ORAL | 0 refills | Status: AC
Start: 2023-12-31 — End: ?
  Filled 2023-12-31: qty 90, 30d supply, fill #0

## 2023-12-31 MED ORDER — TIZANIDINE HCL 2 MG PO TABS
2.0000 mg | ORAL_TABLET | Freq: Three times a day (TID) | ORAL | 0 refills | Status: AC | PRN
  Filled 2023-12-31: qty 30, 10d supply, fill #0

## 2023-12-31 MED ORDER — SERTRALINE HCL 100 MG PO TABS
100.0000 mg | ORAL_TABLET | Freq: Every day | ORAL | 0 refills | Status: AC
  Filled 2023-12-31: qty 30, 30d supply, fill #0

## 2023-12-31 MED ORDER — QUETIAPINE FUMARATE 200 MG PO TABS
200.0000 mg | ORAL_TABLET | Freq: Every day | ORAL | 0 refills | Status: AC
  Filled 2023-12-31: qty 30, 30d supply, fill #0

## 2023-12-31 MED ORDER — ALBUTEROL SULFATE HFA 108 (90 BASE) MCG/ACT IN AERS
INHALATION_SPRAY | RESPIRATORY_TRACT | 0 refills | Status: AC
  Filled 2023-12-31: qty 6.7, 30d supply, fill #0

## 2023-12-31 MED ORDER — LIDOCAINE 5 % EX PTCH: 2.0000 | MEDICATED_PATCH | CUTANEOUS | Status: AC

## 2023-12-31 MED ORDER — UMECLIDINIUM-VILANTEROL 62.5-25 MCG/ACT IN AEPB
1.0000 | INHALATION_SPRAY | Freq: Every day | RESPIRATORY_TRACT | 0 refills | Status: AC
  Filled 2023-12-31: qty 60, 30d supply, fill #0

## 2023-12-31 MED ORDER — HYDROXYZINE HCL 25 MG PO TABS
25.0000 mg | ORAL_TABLET | Freq: Three times a day (TID) | ORAL | 0 refills | Status: AC | PRN
  Filled 2023-12-31: qty 20, 7d supply, fill #0

## 2023-12-31 MED ORDER — SODIUM ZIRCONIUM CYCLOSILICATE 5 G PO PACK
5.0000 g | PACK | Freq: Once | ORAL | Status: AC
  Administered 2023-12-31: 5 g via ORAL
  Filled 2023-12-31: qty 1

## 2023-12-31 MED ORDER — OXYCODONE HCL 5 MG PO TABS
5.0000 mg | ORAL_TABLET | Freq: Every day | ORAL | 0 refills | Status: AC | PRN
  Filled 2023-12-31: qty 10, 3d supply, fill #0

## 2023-12-31 MED ORDER — ACETAMINOPHEN 325 MG PO TABS: 650.0000 mg | ORAL_TABLET | Freq: Four times a day (QID) | ORAL | Status: AC | PRN

## 2023-12-31 NOTE — TOC Progression Note (Signed)
 Transition of Care [facility]) - Progression Note    Patient Details  Name: Rita Hunter MRN: [id number] Date of Birth: 03/18/1970  Transition of Care [facility]) CM/SW Contact  Waddell Barnie Rama, RN Phone Number: 12/31/2023, 10:48 AM  Clinical Narrative:    Patient is for dc today, will need home oxygen,  of 1 to 2 liters,   patient stays in her fleeta,  so she will probably need a POC.  Patient does not have a preference of DME  Agency.  NCM made referral to [employer].  [employer] will supply the portable POC.                     Expected Discharge Plan and Services         Expected Discharge Date: 12/31/23                                     Social Drivers of Health (SDOH) Interventions SDOH Screenings   Food Insecurity: Food Insecurity Present (12/26/2023)  Housing: High Risk (12/26/2023)  Transportation Needs: No Transportation Needs (12/26/2023)  Utilities: At Risk (12/26/2023)  Depression (PHQ2-9): Medium Risk (12/25/2018)  Financial Resource Strain: Low Risk  (06/10/2018)  Physical Activity: Inactive (06/10/2018)  Social Connections: Moderately Isolated (06/10/2018)  Stress: Stress Concern Present (06/10/2018)  Tobacco Use: High Risk (12/26/2023)    Readmission Risk Interventions     No data to display

## 2023-12-31 NOTE — Telephone Encounter (Signed)
 Copied from CRM [id number]. Topic: Appointments - Scheduling Inquiry for Clinic >> Dec 31, 2023 11:12 AM Rita Hunter wrote: Reason for CRM: Pt is being discharge from the hospital 12/31/2023, but Mrs.Rita Hunter has nothing available until 02/2024.  Please call patient at [phone] if we can see her sooner.   Thank you.

## 2023-12-31 NOTE — Telephone Encounter (Signed)
 Not a patient of this practice

## 2023-12-31 NOTE — TOC Transition Note (Addendum)
 Transition of Care [facility]) - Discharge Note   Patient Details  Name: Rita Hunter MRN: [id number] Date of Birth: 11-05-1970  Transition of Care [facility]) CM/SW Contact:  Waddell Barnie Rama, RN Phone Number: 12/31/2023, 10:51 AM   Clinical Narrative:    For dc today, [employer] to supply the POC.  Patient's phone number is [phone].  [facility] will [facility] the patient to schedule a follow up apt.           Patient Goals and CMS Choice            Discharge Placement                       Discharge Plan and Services Additional resources added to the After Visit Summary for                                       Social Drivers of Health (SDOH) Interventions SDOH Screenings   Food Insecurity: Food Insecurity Present (12/26/2023)  Housing: High Risk (12/26/2023)  Transportation Needs: No Transportation Needs (12/26/2023)  Utilities: At Risk (12/26/2023)  Depression (PHQ2-9): Medium Risk (12/25/2018)  Financial Resource Strain: Low Risk  (06/10/2018)  Physical Activity: Inactive (06/10/2018)  Social Connections: Moderately Isolated (06/10/2018)  Stress: Stress Concern Present (06/10/2018)  Tobacco Use: High Risk (12/26/2023)     Readmission Risk Interventions     No data to display

## 2023-12-31 NOTE — Telephone Encounter (Unsigned)
 Copied from CRM [id number]. Topic: Appointments - Scheduling Inquiry for Clinic >> Dec 31, 2023 11:12 AM Anairis L wrote: Reason for CRM: Pt is being discharge from the hospital 12/31/2023, but Mrs.Celestia has nothing available until 02/2024.  Please call patient at [phone] if we can see her sooner.   Thank you.

## 2023-12-31 NOTE — Assessment & Plan Note (Deleted)
 Chronic chest and back. Difficult to control over hospital course. Continue pain regimen.  - Tylenol  650mg  q6h for mild pain - Decrease Oxycodone 5mg  for moderate pain to once a day - Tizanidine  2mg  q8h PRN for muscle spasms - Gabapentin  600 mg TID - Lidocaine Patch q24h  - Heating pad

## 2023-12-31 NOTE — Assessment & Plan Note (Deleted)
 Schizophrenia/Anxiety/Depression: hold home seroquel  with qtc of 502, zoloft  when med rec complete Social needs: SW consult placed for PCP, unhoused status, food insecurity, difficulty obtaining medications Lung Nodules on CT: follow up outpatient with PET-CT

## 2023-12-31 NOTE — Assessment & Plan Note (Deleted)
-   Goal O2 88-92% - Anoro Ellipta 1 puff daily for maintenance  - DuoNeb TID scheduled - Albuterol  2.5 mg neb q4h PRN  - Azithromycin  500 mg (11/15-11/17) - Continue Prednisone  40 mg (11/15-11/19) - Ambulatory O2 once weaned to RA   - With ambulatory walking test, satted down to 84% on RA ambulation and back up to 92% on 2L with rest  - Alpha 1 antitrypsin pending  - Consult pulmonary, appreciate recs  - Cor pulmonale? Recommend RHC and cards eval - Echo 11/15: LVEF 55-60%, moderate PAH  - PCCM consulted, appreciate recs   - pt may benefit from RHC   - may require O2 supplementation in short to medium term    - CT scan follow up in 3 months for lung nodules - TOC for housing, SDOH  - CSW provided patient with community resources and shelter list.

## 2023-12-31 NOTE — Discharge Summary (Cosign Needed Addendum)
 Family Medicine Teaching [facility] Discharge Summary  Patient name: Rita Hunter Medical record number: [id number] Date of birth: 23-Oct-1970 Age: 53 y.o. Gender: female Date of Admission: 12/26/2023  Date of Discharge: 12/31/2023 Admitting Physician: Lauraine Norse, DO  Primary Care Provider: Pcp, No Consultants: Pulmonology, Cardiology  Indication for Hospitalization: COPD exacerbation  Brief Hospital Course:  Rita Hunter with a history of COPD who was admitted to the [facility] Medicine Teaching Service at [facility] for COPD exacerbation. Her hospital course is detailed below:  COPD exacerbation  Acute hypoxic respiratory failure Patient presented with productive cough and worsening dyspnea found to be in acute hypoxic respiratory failure. CXR with emphysema, no acute abnormality. CTA CAP significant for multiple bilateral pulmonary nodules measuring up to 11 mm . Patient started on azithromycin  course (11/15-11/17) and prednisone  course (11/15-11/19). She had a new O2 requirement of 2L while ambulating and at rest. Pulmonology was consulted and recommended RHC, O2 supplementation in short to medium term and CT scan follow up in 3 months for lung nodules. Cardiology was consulted for RHC which was normal. By time of discharge, patient satting well on 2L O2 and sent home on oxygen.   Other chronic conditions were medically managed with home medications and formulary alternatives as necessary (Schizophrenia/Anxiety/Depression)  PCP Follow-up Recommendations: CT/PET Scan outpatient to follow-up lung nodules. Referral to pain management. Follow-up alpha-1-antitrypsin   Discharge Diagnoses/Problem List:  COPD Exacerbation  Acute hypoxic respiratory failure Multiple lung nodules  Disposition: Home  Discharge Condition: Stable  Discharge Exam: General: resting comfortably on 1.5L [name] in hospital bed in no acute distress           Cardiovascular: RRR, no m/r/g            Respiratory: normal WOB on 2L [name], no wheezes rales or rhonchi Abdomen: soft, non-tender, non-distended, BS present Extremities: No peripheral edema  Significant Procedures: RHC  Significant Labs and Imaging:  Recent Labs  Lab 12/30/23 1438 12/31/23 0305  WBC  --  10.3  HGB 15.0 12.9  HCT 44.0 40.2  PLT  --  212   Recent Labs  Lab 12/30/23 0307 12/30/23 1438 12/31/23 0305  NA 139 138 138  K 4.7 5.4* 5.2*  CL 97*  --  96*  CO2 32  --  32  GLUCOSE 124*  --  116*  BUN 25*  --  32*  CREATININE 0.85  --  1.01*  CALCIUM 9.1  --  9.2   CTA CAP 12/26/2023:  IMPRESSION: 1. No evidence for acute intramural hematoma or dissection of the thoracoabdominal aorta. No thoracoabdominal aortic aneurysm. 2. Multiple bilateral pulmonary nodules measuring up to 11 mm and warranting close follow-up to exclude neoplasm. Given dominant nodules measuring in the 10-11 mm size range, PET-CT may prove helpful to further evaluate. 3. No acute findings in the abdomen or pelvis.  Discharge Medications:  Allergies as of 12/31/2023       Reactions   Mobic [meloxicam] Swelling   Swelling of tongue   Trazodone  And Nefazodone    Ultram [tramadol Hcl] Swelling   Swelling of tongue        Medication List     PAUSE taking these medications    KLONOPIN PO Wait to take this until your doctor or other care provider tells you to start again. Take 1 tablet by mouth 3 (three) times daily as needed (anxiety).   lisinopril  10 MG tablet Wait to take this until your doctor or other  care provider tells you to start again. Commonly known as: ZESTRIL  Take 1 tablet by mouth every day What changed: when to take this   losartan 25 MG tablet Wait to take this until your doctor or other care provider tells you to start again. Commonly known as: COZAAR Take 25 mg by mouth at bedtime.   metoprolol  tartrate 50 MG tablet Wait to take this until your doctor or other care provider tells you to start  again. Commonly known as: LOPRESSOR  Take 1 tablet by mouth twice daily. Need appt   rOPINIRole  2 MG tablet Wait to take this until your doctor or other care provider tells you to start again. Commonly known as: REQUIP  TAKE 1 TO 2 TABLETS(2 TO 4 MG) BY MOUTH AT BEDTIME What changed:  how much to take how to take this when to take this additional instructions   topiramate  25 MG tablet Wait to take this until your doctor or other care provider tells you to start again. Commonly known as: TOPAMAX  Take 1 tablet (25 mg total) by mouth 2 (two) times daily.   UNKNOWN TO PATIENT Wait to take this until your doctor or other care provider tells you to start again. Take 50 mg by mouth at bedtime. Unknown medication for sleep, beginning with N       STOP taking these medications    fluticasone -salmeterol 115-21 MCG/ACT inhaler Commonly known as: ADVAIR  HFA   oxyCODONE -acetaminophen  10-325 MG tablet Commonly known as: PERCOCET   Spiriva  HandiHaler 18 MCG Caps Generic drug: Tiotropium Bromide        TAKE these medications    acetaminophen  325 MG tablet Commonly known as: TYLENOL  Take 2 tablets (650 mg total) by mouth every 6 (six) hours as needed.   albuterol  108 (90 Base) MCG/ACT inhaler Commonly known as: ProAir  HFA Inhale 2 puffs by mouth every 4 hours as needed for wheezing or shortness of breath   Anoro Ellipta  62.5-25 MCG/ACT Aepb Generic drug: umeclidinium-vilanterol Inhale 1 puff into the lungs daily. Start taking on: January 01, 2024   gabapentin  600 MG tablet Commonly known as: NEURONTIN  Take 1 tablet by mouth 3 times a day What changed:  how much to take how to take this when to take this additional instructions   hydrOXYzine  25 MG tablet Commonly known as: ATARAX  Take 1 tablet (25 mg total) by mouth 3 (three) times daily as needed for anxiety.   lidocaine  5 % Commonly known as: LIDODERM  Place 2 patches onto the skin daily. Remove & Discard patch  within 12 hours or as directed by MD   oxyCODONE  5 MG immediate release tablet Commonly known as: Oxy IR/ROXICODONE  Take 1 tablet (5 mg total) by mouth daily as needed for severe pain (pain score 7-10).   QUEtiapine  200 MG tablet Commonly known as: SEROQUEL  Take 1 tablet (200 mg total) by mouth at bedtime.   sertraline  100 MG tablet Commonly known as: ZOLOFT  Take 1 tablet (100 mg total) by mouth daily. What changed: See the new instructions.   tiZANidine  2 MG tablet Commonly known as: ZANAFLEX  Take 1 tablet (2 mg total) by mouth every 8 (eight) hours as needed for muscle spasms. What changed:  medication strength how much to take how to take this when to take this reasons to take this additional instructions               Durable Medical Equipment  (From admission, onward)           Start  Ordered   12/31/23 0000  For home use only DME oxygen       Comments: POC at a setting of 2 liters  Question Answer Comment  Length of Need 12 Months   Mode or (Route) Nasal cannula   Liters per Minute 2   Frequency Continuous (stationary and portable oxygen unit needed)   Oxygen delivery system: Portable concentrator (POC)   Oxygen delivery system: Gas      12/31/23 1034            Discharge Instructions: Please refer to Patient Instructions section of EMR for full details.  Patient was counseled important signs and symptoms that should prompt return to medical care, changes in medications, dietary instructions, activity restrictions, and follow up appointments.   Follow-Up Appointments:  Follow-up Information     [facility] Follow [facility].   Specialty: Family Medicine Why: Office will call you to set up an apt for you , if you do not hear from them please give them a call . thank you Contact information: [address] [place] [facility] [facility] [place]  [address] [phone] Additional information: [address]  [place],  [place] [address]                Lupie Credit, DO 12/31/2023, 12:23 PM PGY-1, [facility] Family Medicine  I have verified that the service and findings are accurately documented in the resident's note above.  Damien Cassis, MD                  12/31/2023, 2:38 PM

## 2023-12-31 NOTE — Progress Notes (Signed)
 Occupational Therapy Treatment Patient Details Name: Rita Hunter MRN: [id number] DOB: 11-19-1970 Today's Date: 12/31/2023   History of present illness 53 y.o F adm 12/26/23 for COPD exacerbation. PMH: COPD, depression, HTN, schizophrenia, chronic pain   OT comments  Pt agreeable to session and want to get cleaned up. Pt was able to complete ambulation to toilet and completed toileting tasks with distant supervision with post activity o2 on RA at  94-97% Then completed light sponge bath at sink with cues on pacing and sitting to standing for compensation with o2 on RA at 94%. She then completed hair care with shampoo cap in a seated position and then ambulated to bed and noted o2 at 79% pt then encouraged on deep breathing patterns while on RA and finding a comfortable position and was able to bring back up to 91% on RA. At this time no follow up recommendations but administered energy conservation strategies handout in session.        If plan is discharge home, recommend the following:  Assistance with cooking/housework   Equipment Recommendations  None recommended by OT (due to living in [place])    Recommendations for Other Services      Precautions / Restrictions Precautions Precautions: Fall;Other (comment) Recall of Precautions/Restrictions: Intact Precaution/Restrictions Comments: monitor O2 Restrictions Weight Bearing Restrictions Per Provider Order: No       Mobility Bed Mobility Overal bed mobility: Independent                  Transfers Overall transfer level: Independent Equipment used: None                     Balance Overall balance assessment: Mild deficits observed, not formally tested                                         ADL either performed or assessed with clinical judgement   ADL Overall ADL's : Needs assistance/impaired Eating/Feeding: Independent;Sitting   Grooming: Supervision/safety;Sitting;Standing   Upper Body  Bathing: Supervision/ safety;Sitting;Standing   Lower Body Bathing: Supervison/ safety;Cueing for safety;Cueing for sequencing;Sit to/from stand   Upper Body Dressing : Supervision/safety;Standing;Sitting   Lower Body Dressing: Supervision/safety;Sit to/from stand   Toilet Transfer: Supervision/safety   Toileting- Architect and Hygiene: Supervision/safety       Functional mobility during ADLs: Supervision/safety      Extremity/Trunk Assessment Upper Extremity Assessment Upper Extremity Assessment: Overall WFL for tasks assessed   Lower Extremity Assessment Lower Extremity Assessment: Defer to PT evaluation        Vision   Vision Assessment?: No apparent visual deficits   Perception     Praxis     Communication Communication Communication: No apparent difficulties   Cognition Arousal: Alert Behavior During Therapy: WFL for tasks assessed/performed Cognition: No apparent impairments                               Following commands: Intact        Cueing   Cueing Techniques: Verbal cues  Exercises      Shoulder Instructions       General Comments      Pertinent Vitals/ Pain       Pain Assessment Pain Assessment: Faces Faces Pain Scale: Hurts a little bit Pain Location: general aching Pain Descriptors / Indicators: Aching Pain Intervention(s):  Limited activity within patient's tolerance, Monitored during session  Home Living                                          Prior Functioning/Environment              Frequency  Min 2X/week        Progress Toward Goals  OT Goals(current goals can now be found in the care plan section)  Progress towards OT goals: Progressing toward goals  Acute Rehab OT Goals Patient Stated Goal: to get better OT Goal Formulation: With patient Time For Goal Achievement: 01/10/24 Potential to Achieve Goals: Good ADL Goals Pt Will Transfer to Toilet: with modified  independence Pt Will Perform Toileting - Clothing Manipulation and hygiene: with modified independence Pt Will Perform Tub/Shower Transfer: with modified independence Additional ADL Goal #1: Pt will be able to complete light ADLS/IADLS as need with compensation  Plan      Co-evaluation                 AM-PAC OT 6 Clicks Daily Activity     Outcome Measure   Help from another person eating meals?: None Help from another person taking care of personal grooming?: None Help from another person toileting, which includes using toliet, bedpan, or urinal?: None Help from another person bathing (including washing, rinsing, drying)?: None Help from another person to put on and taking off regular upper body clothing?: None Help from another person to put on and taking off regular lower body clothing?: None 6 Click Score: 24    End of Session Equipment Utilized During Treatment: Gait belt  OT Visit Diagnosis: Unsteadiness on feet (R26.81)   Activity Tolerance Patient tolerated treatment well   Patient Left in bed;with call bell/phone within reach   Nurse Communication Mobility status (o2)        Time: 1000-1032 OT Time Calculation (min): 32 min  Charges: OT General Charges $OT Visit: 1 Visit OT Treatments $Self Care/Home Management : 23-37 mins  Warrick POUR OTR/L  Acute Rehab Services  [phone] office number   Warrick Berber 12/31/2023, 10:39 AM

## 2024-01-01 ENCOUNTER — Other Ambulatory Visit (HOSPITAL_COMMUNITY): Payer: Self-pay

## 2024-01-02 ENCOUNTER — Other Ambulatory Visit (HOSPITAL_COMMUNITY): Payer: Self-pay

## 2024-01-02 ENCOUNTER — Telehealth (HOSPITAL_COMMUNITY): Payer: Self-pay | Admitting: Pharmacy Technician

## 2024-01-02 NOTE — Telephone Encounter (Signed)
 Pharmacy Patient Advocate Encounter  Received notification from [employer] that Prior Authorization for QUEtiapine  Fumarate 200MG  tablets  has been APPROVED from 01/02/2024 to 01/01/2025   PA #/Case ID/Reference #: [id number] KEY: [id number]

## 2024-02-02 DIAGNOSIS — J439 Emphysema, unspecified: Secondary | ICD-10-CM | POA: Diagnosis not present
